# Patient Record
Sex: Female | Born: 1975 | Hispanic: Yes | Marital: Single | Smoking: Never smoker
Health system: Southern US, Community
[De-identification: ages and names within clinical notes are randomized; demographics above are authoritative.]

## PROBLEM LIST (undated history)

## (undated) DIAGNOSIS — T8859XA Other complications of anesthesia, initial encounter: Secondary | ICD-10-CM

## (undated) DIAGNOSIS — O2300 Infections of kidney in pregnancy, unspecified trimester: Secondary | ICD-10-CM

## (undated) DIAGNOSIS — B009 Herpesviral infection, unspecified: Secondary | ICD-10-CM

## (undated) DIAGNOSIS — K219 Gastro-esophageal reflux disease without esophagitis: Secondary | ICD-10-CM

## (undated) HISTORY — PX: APPENDECTOMY: SHX54

## (undated) HISTORY — DX: Infections of kidney in pregnancy, unspecified trimester: O23.00

## (undated) HISTORY — DX: Gastro-esophageal reflux disease without esophagitis: K21.9

---

## 1998-04-06 ENCOUNTER — Inpatient Hospital Stay (HOSPITAL_COMMUNITY): Admission: EM | Admit: 1998-04-06 | Discharge: 1998-04-07 | Payer: Self-pay | Admitting: Emergency Medicine

## 1998-12-29 ENCOUNTER — Ambulatory Visit (HOSPITAL_COMMUNITY): Admission: RE | Admit: 1998-12-29 | Discharge: 1998-12-29 | Payer: Self-pay | Admitting: Obstetrics

## 1999-06-30 ENCOUNTER — Inpatient Hospital Stay (HOSPITAL_COMMUNITY): Admission: AD | Admit: 1999-06-30 | Discharge: 1999-07-02 | Payer: Self-pay | Admitting: *Deleted

## 1999-06-30 ENCOUNTER — Inpatient Hospital Stay (HOSPITAL_COMMUNITY): Admission: AD | Admit: 1999-06-30 | Discharge: 1999-06-30 | Payer: Self-pay | Admitting: Obstetrics

## 1999-06-30 ENCOUNTER — Encounter: Payer: Self-pay | Admitting: Obstetrics & Gynecology

## 2000-04-04 ENCOUNTER — Encounter: Admission: RE | Admit: 2000-04-04 | Discharge: 2000-04-04 | Payer: Self-pay | Admitting: Family Medicine

## 2006-02-20 ENCOUNTER — Inpatient Hospital Stay (HOSPITAL_COMMUNITY): Admission: AD | Admit: 2006-02-20 | Discharge: 2006-02-20 | Payer: Self-pay | Admitting: Obstetrics

## 2006-03-22 ENCOUNTER — Inpatient Hospital Stay (HOSPITAL_COMMUNITY): Admission: AD | Admit: 2006-03-22 | Discharge: 2006-03-24 | Payer: Self-pay | Admitting: Obstetrics

## 2011-05-10 ENCOUNTER — Encounter: Payer: Self-pay | Admitting: Family Medicine

## 2011-05-10 ENCOUNTER — Ambulatory Visit (INDEPENDENT_AMBULATORY_CARE_PROVIDER_SITE_OTHER): Payer: Self-pay | Admitting: Family Medicine

## 2011-05-10 VITALS — BP 120/71 | HR 80 | Ht 64.25 in | Wt 150.6 lb

## 2011-05-10 DIAGNOSIS — E663 Overweight: Secondary | ICD-10-CM | POA: Insufficient documentation

## 2011-05-10 DIAGNOSIS — Z6825 Body mass index (BMI) 25.0-25.9, adult: Secondary | ICD-10-CM

## 2011-05-10 NOTE — Progress Notes (Signed)
  Subjective:    Patient ID: Amy Salazar, female    DOB: April 25, 1976, 35 y.o.   MRN: [id number]  HPI  Patient is here to establish care.  Was seen at this clinic over 10 years ago for prenatal care, but has been going to Urgent Care centers since.  Wants to have a PCP for annual physical exams, cancer screening, etc.  Patient currently has no concerns or complaints.   Has not been to the hospital or ED recently.  Eats a well balanced diet but does not exercise.  Doing well overall.    Review of Systems  Constitutional: Negative for fever, chills, appetite change and fatigue.  HENT: Negative for congestion and rhinorrhea.   Respiratory: Negative for cough, chest tightness and shortness of breath.   Cardiovascular: Negative for chest pain.  Genitourinary: Negative for difficulty urinating.  Neurological: Negative for headaches.       Objective:   Physical Exam  Constitutional: She appears well-developed and well-nourished.       overweight  Neck: Normal range of motion. Neck supple.  Cardiovascular: Normal rate and regular rhythm.  Exam reveals no gallop and no friction rub.   No murmur heard. Pulmonary/Chest: Effort normal and breath sounds normal. No respiratory distress. She has no wheezes. She has no rales. She exhibits no tenderness.  Skin: Skin is warm and dry.  Psychiatric: She has a normal mood and affect. Her behavior is normal.          Assessment & Plan:

## 2011-05-11 NOTE — Patient Instructions (Signed)
Thanks for coming in today. Please schedule an annual adult physical in 2-4 months. Also, schedule a lab visit with Kendal Hymen for a CBC and cholesterol panel. Please do not hesitate to call our office if you have any questions/concerns, Dr. Tye Savoy

## 2011-05-11 NOTE — Assessment & Plan Note (Signed)
Discussed importance of eating a well balanced diet and exercising 3-4 times per week.   Will follow up in 4 months for annual adult physical.  Will draw BMET and FLP before appointment.  Will discuss further preventative medicine, cancer screening, etc. Patient understood and agreed with plan.

## 2011-05-24 ENCOUNTER — Encounter: Payer: Self-pay | Admitting: Family Medicine

## 2011-06-23 ENCOUNTER — Ambulatory Visit (INDEPENDENT_AMBULATORY_CARE_PROVIDER_SITE_OTHER): Payer: Self-pay | Admitting: Family Medicine

## 2011-06-23 ENCOUNTER — Other Ambulatory Visit (HOSPITAL_COMMUNITY)
Admission: RE | Admit: 2011-06-23 | Discharge: 2011-06-23 | Disposition: A | Discharge status: Home / Self Care | Payer: Self-pay | Source: Ambulatory Visit | Attending: Family Medicine | Admitting: Family Medicine

## 2011-06-23 VITALS — BP 110/69 | HR 64 | Temp 97.7°F | Wt 153.0 lb

## 2011-06-23 DIAGNOSIS — N9489 Other specified conditions associated with female genital organs and menstrual cycle: Secondary | ICD-10-CM

## 2011-06-23 DIAGNOSIS — B009 Herpesviral infection, unspecified: Secondary | ICD-10-CM | POA: Insufficient documentation

## 2011-06-23 DIAGNOSIS — R300 Dysuria: Secondary | ICD-10-CM

## 2011-06-23 DIAGNOSIS — N766 Ulceration of vulva: Secondary | ICD-10-CM

## 2011-06-23 DIAGNOSIS — Z124 Encounter for screening for malignant neoplasm of cervix: Secondary | ICD-10-CM

## 2011-06-23 DIAGNOSIS — Z01419 Encounter for gynecological examination (general) (routine) without abnormal findings: Secondary | ICD-10-CM | POA: Insufficient documentation

## 2011-06-23 DIAGNOSIS — N912 Amenorrhea, unspecified: Secondary | ICD-10-CM | POA: Insufficient documentation

## 2011-06-23 DIAGNOSIS — N898 Other specified noninflammatory disorders of vagina: Secondary | ICD-10-CM

## 2011-06-23 DIAGNOSIS — Z331 Pregnant state, incidental: Secondary | ICD-10-CM

## 2011-06-23 LAB — POCT URINALYSIS DIPSTICK
Bilirubin, UA: NEGATIVE
Glucose, UA: NEGATIVE
Ketones, UA: 15
Nitrite, UA: NEGATIVE
Protein, UA: NEGATIVE
Spec Grav, UA: >=1.03
Urobilinogen, UA: 0.2
pH, UA: 5.5

## 2011-06-23 LAB — POCT URINE PREGNANCY: Preg Test, Ur: POSITIVE

## 2011-06-23 LAB — POCT WET PREP (WET MOUNT): Trichomonas Wet Prep HPF POC: NEGATIVE

## 2011-06-23 LAB — POCT UA - MICROSCOPIC ONLY

## 2011-06-23 MED ORDER — FLUCONAZOLE 150 MG PO TABS: 150.0000 mg | ORAL_TABLET | Freq: Once | ORAL | Status: AC

## 2011-06-23 MED ORDER — ACYCLOVIR 400 MG PO TABS: ORAL_TABLET | ORAL | Status: DC

## 2011-06-23 NOTE — Progress Notes (Signed)
  Subjective:    Patient ID: Amy Salazar, female    DOB: 03/29/1976, 35 y.o.   MRN: [id number]  HPI   4 days of dysuria, No urinary frequency.  Some lower pelvic pain.  Vaginal discharge, no bleeding vaginally or with urine.  Notes no period since 10th-15th of May. Has not checked a pregnancy test.  Is unmarried, not a long term relationship 9-10 months. Was not planning pregnancy.  No birth control. Sometimes condoms.  No history of previous UTI or vaginal infections.  G3- children ages 42, 5, 61.  I have reviewed patient's  PMH, FH, and Social history and Medications as related to this visit.  Review of Systems No fever, nausea vomiting.  + chills. Denies tender beasts, mood change.    Objective:   Physical Exam  GEN: Alert & Oriented, No acute distress CV:  Regular Rate & Rhythm, no murmur Respiratory:  Normal work of breathing, CTAB Abd:  + BS, soft, no tenderness to palpation Ext: no pre-tibial edema Pelvic Exam:        External: normal female genitalia without lesions or masses        Vagina: normal without lesions or masses.  Copious white vaginal discharge notes in vagina        Cervix: normal without lesions or masses        Adnexa: normal bimanual exam without masses or fullness        Uterus: normal by palpation        Pap smear: performed        Samples for Wet prep, GC/Chlamydia obtained        HSV culture obtained.  Many painful shallow ulcerations located around labia minora, opening of vagina       Assessment & Plan:

## 2011-06-23 NOTE — Patient Instructions (Signed)
Make follow-up in 1 week El ABC del Psychiatrist (ABC's of Pregnancy) A La atencin antes del parto es muy importante. Asegrese de Science writer a su mdico y recibir atencin prenatal tan rpido como usted crea que est embarazada. En este momento, se la controlar por posibles infecciones, anormalidades genticas y problemas potenciales. Este es el momento para discutir la dieta, el ejercicio, el [place], los medicamentos, el [place], los medicamentos para el dolor durante el parto y la posibilidad de un parto por Copy. Haga todas las preguntas que puedan preocuparla. Es importante que consulte a su mdico con regularidad durante todo Firefighter. Evite el contacto con sustancias txicas y productos qumicos, como disolventes de limpieza, plomo y mercurio, algunos insecticidas y pinturas. Las mujeres embarazadas deben evitar la exposicin a los vapores de Power, y los humos que la hagan sentir enferma, mareada o dbil. Cuando sea posible, tenga una consulta con su mdico antes del embarazo para recibir Optometrist pudiera sugerir, como tomar cido flico, Radio producer ejercicios, dejar de fumar, evitar las bebidas alcohlicas, etc. B La lactancia materna es la opcin ms saludable para usted y su beb. Tiene muchos beneficios nutricionales para al beb y [address]. Tambin crea un vnculo muy estrecho entre el beb y [place]. Hable con su mdico, su familia, sus amigos y su empleador acerca de cmo usted decidir alimentar a su beb y como pueden ayudarle a decidir. No todos los defectos congnitos pueden ser evitados, pero una mujer puede tomar decisiones para aumentar las posibilidades de tener un Beb sano. Muchos defectos congnitos ocurren al principio del Psychiatrist, a veces antes de que la mujer sepa que est [place]. Los defectos o anormalidades congnitas de cualquier nio de su familia o la del padre deben ser comentados con su mdico. Obtener un buen sostn para los cambios del tamao de las [place].  selo en especial cuando hace ejercicios o cuando amamante.   C Festeje la noticia de su embarazo con su Cnyuge/padre y su familia. Las Clases de parto son tiles para usted y Product/process development scientist cnyuge/padre, porque le ayudan a entender lo que sucede durante el Psychiatrist y [place]. El parto por cesrea se debe discutir con el mdico para estar preparado para esa posibilidad. Las ventajas y las desventajas de la [place] si es un nio, se deben comentar con Presenter, broadcasting. El tabaquismo durante el embarazo puede dar como resultado bebs con bajo peso al nacer. Se ha asociado con la infertilidad, abortos espontneos, embarazos ectpicos, mortalidad infantil y Insurance underwriter salud (morbilidad) en la infancia. Adems, el tabaquismo puede causar problemas de aprendizaje a largo plazo. Si usted fuma, debe tratar de dejar de fumar antes de [place] y no durante el embarazo. El humo secundario tambin puede hacerles dao a la mam y a su beb en desarrollo. Es Neomia Dear buena idea pedirle a la gente que deje de fumar a su alrededor Academic librarian y despus del nacimiento del beb. El [place] extra es necesario durante el Psychiatrist y se Occupational psychologist en las vitaminas prenatales, en los productos lcteos, vegetales de hojas verdes y en los suplementos de calcio. D Una Dieta saludable de acuerdo a su peso y su talla actual, junto con las vitaminas y suplementos minerales debe ser discutida con su mdico. El abuso /o la violencia Domstica deben darse a conocer inmediatamente para corregir la situacin. Tome ms agua cuando haga ejercicios para mantenerse hidratada. Por lo general las molestias de la espalda y las piernas aparecen y [place] a  partir de mediados del segundo trimestre hasta el nacimiento del beb. Eso se debe al aumento de tamao del beb y del tero, que tambin puede afectar su equilibro. No consuma Drogas. Las drogas ilegales pueden daar seriamente al beb y a usted. Beba ms lquidos (lo mejor es el agua) Durante todo el  embarazo para ayudar a su cuerpo a Radio producer aumento del volumen sanguneo. Beba por lo menos 6 a 8 vasos de [place], jugo de frutas o [employer]. Una buena manera de saber que est bebiendo suficiente lquido es cuando la [place] se ve casi como el agua clara o de color amarillo muy claro.   E Coma alimentos sanos para obtener los nutrientes que usted y su beb necesitan al Psychologist, clinical. Sus alimentos deben Johnson [employer] Johnson cinco grupos bsicos. El Ejercicio (30 minutos de ejercicio leve a moderado al da) es importante y se aconseja durante el [place], si no tiene problemas de [place]. Si el ejercicio le causa malestar o mareos debe interrumpirlo e informar a su mdico. Las emociones durante el embarazo pueden pasar de la euforia a la depresin y deben ser comprendidos por usted, su pareja y su familia. F La evaluacin fetal con ecografas, la amniocentesis y los controles durante el [place] y [place] parto son algo habitual y a Research officer, political party. Tome 400 mg. de cido Southern Company, si es posible antes y Energy Transfer Partners primeros meses del Psychiatrist para reducir el riesgo de defectos congnitos del cerebro y la columna vertebral. Todas las mujeres que podran quedar embarazadas deben tomar una vitamina con cido flico todos [place]. Tambin es importante seguir una dieta saludable con alimentos enriquecidos (cereales enriquecidos, arroz, panes y pastas) y alimentos que sean fuentes naturales de cido flico (jugo de naranja, vegetales de hojas verdes, frijoles, man, brcoli, esprragos, arvejas y Therapist, occupational). El padre debe involucrarse en todos los aspectos del embarazo incluyendo el cuidado prenatal, las clases de [place], [place] parto y el tiempo despus del [place]. Los padres tambin pueden tener problemas emocionales como ser [place], [place] econmicos y llevar adelante [place]. G Las pruebas Genticas se deben Landscape architect. Es Secretary/administrator la historia de su familia y la del padre. Si ha habido problemas con  embarazos o defectos de nacimiento en su familia, informe de inmediato a su mdico. Adems, los consejeros genticos pueden hablar con usted acerca de la informacin que pueda necesitar en la toma de decisiones acerca de tener una familia. Usted puede llamar a un centro mdico en su rea para ayudar en la bsqueda de un consejero gentico certificado por el consejo. La asesora y prueba gentica se deben hacer antes del embarazo siempre que sea posible, especialmente si hay antecedentes de problemas en la familia del padre o de la [place]. Ciertos grupos tnicos tienen un mayor riesgo de defectos genticos. H Familiarcese con [facility] en el que va a tener su beb. Conozca cunto tiempo se tarda en llegar, la sala de preparto y nacimiento y los procedimientos del hospital. Asegrese de que su seguro mdico es aceptado en ese Environmental consultant. Haga que su Hogar est listo para el beb, incluyendo la ropa, la habitacin del beb (cuando sea posible), muebles y asientos del automvil. El lavado de manos es importante durante todo el da, especialmente despus de tocar carne cruda y aves de corral, de cambiar el paal del beb y de ir al bao. Esto puede ayudarle a Geologist, engineering de grmenes (bacterias) y virus que causan infecciones. Su pelo  puede volverse seco y ms fino, pero volver a la normalidad unas semanas despus de que nazca el beb. La acidez es un problema comn que puede ser tratado tomando anticidos recetados por su mdico, comiendo alimentos en porciones ms pequeas 5  6 veces por da, no beber lquidos al comer, beber entre comidas y elevar la cabecera de su cama 2  3 pulgadas. I El seguro que le d cobertura a usted y al beb, a los gastos del mdico y del hospital debe ser revisado, para saber con anticipacin qu gastos no cubiertos por su plan de seguro deber pagar usted. Si no tiene seguro mdico, por lo general hay clnicas y servicios disponibles para usted en su comunidad. Tome 30 mg. de  hierro durante el [employer] Lots segn lo prescrito por su mdico para reducir el riesgo de niveles bajos de glbulos rojos (anemia) mas adelante en el embarazo. Todas las mujeres en edad frtil deben consumir una dieta rica en hierro. J La madre, el padre y los otros hijos deben hacer un esfuerzo conjunto para adaptarse al [employer] Lots en el aspecto econmico, emocional y psicolgico durante Firefighter. nase a un grupo de apoyo para las futuras [place]. O bien, vaya a clases de crianza de hijos o del parto. La familia tiene que participar cuando sea posible. K Conozca sus lmites. Infrmele a su mdico si experimenta:    Cualquier tipo de dolor.   Clicos fuertes.     Aumenta de peso en un corto perodo de tiempo (5 libras en 3 a 5 das).     Hemorragia vaginal, prdida de lquido amnitico.     Dolor de [place], problemas de visin.     Mareos, Newell [employer]bermaid, le falta el aire.     Dolor en el pecho.     Fiebre de 101 o ms.     Elimina lquido claro por la vagina.     Dolor al Beatrix Shipper.     Violencia familiar.     Latidos irregulares del corazn (palpitaciones).   Latidos rpidos del corazn (taquicardia).     Siente ganas de vomitar (nuseas) y vmitos.     Dificultad para caminar, retencin de lquidos (edema).     Debilidad muscular.     Si su beb tiene disminucin de [place].     Diarrea persistente.     Flujo vaginal anormal.     Contracciones uterinas en intervalos de 20 minutos.     Dolor de espalda que baja por la pierna.     L Aprenda y practique que, Lo que usted come y bebe debe ser con moderacin y sano para usted y su beb. Las drogas PepsiCo el alcohol y la cafena son temas importantes para las mujeres [place]. No hay una cantidad segura de alcohol que una mujer pueda beber durante el [place]. El sndrome de alcoholismo fetal, un trastorno que se caracteriza por retraso del crecimiento, anormalidades faciales y disfuncin del sistema nervioso central, es  causado por el uso de alcohol de la mujer durante el [place]. La cafena, que se encuentra en el t, caf, refrescos y chocolate, tambin deben ser limitados. Asegrese de leer las etiquetas cuando se trata de reducir el consumo de cafena durante el [place]. Ms de 200 alimentos, bebidas y medicamentos sin receta contienen cafena y tienen un ato contenido de sal! Hay cafs y tes que no contienen cafena.   M Las enfermedades mdicas tales como diabetes, epilepsia e hipertensin arterial deben ser tratados y mantenidos bajo control antes  del embarazo siempre que sea posible, pero especialmente durante el embarazo. Consulte con su mdico acerca de cualquier medicamento que pueda ser necesario cambiar o ajustar la dosis. Si est tomando algn medicamento, consulte con su mdico sobre su seguridad para Celanese Corporation o antes de quedar [place], cuando sea posible. Tambin, asegrese de informar todas las hierbas o vitaminas que est tomando. Tambin se trata de medicamentos! Hable con su mdico sobre The Mutual of Omaha, con receta y de venta libre que est tomando. Durante su visita prenatal, discuta los Chesapeake Energy su mdico le puede dar durante el parto y el nacimiento. N Nunca tenga miedo de preguntar a su mdico acerca de su salud, el progreso del [place], problemas familiares, situaciones de estrs y pedirle la recomendacin de un pediatra, si no tiene uno. Es mejor tomar todas las precauciones y Administrator, Civil Service pregunta o preocupacin que usted tenga durante las visitas a su consultorio. Es Neomia Dear buena idea escribir sus preguntas antes de visitar al American Express. O Los medicamentos de [place] para tos y resfriados pueden contener alcohol u otros ingredientes que se deben Theatre manager. Consulte con su mdico sobre los Express Scripts, hierbas o medicamentos de venta libre que est tomando o puede tomar durante el Psychiatrist.   P La actividad fsica durante el  embarazo puede beneficiarla tanto a usted y a su beb al disminuir la incomodidad y la fatiga produciendo una sensacin de [place], y el aumento de la probabilidad de una pronta recuperacin despus del parto. El ejercicio ligero a moderado Academic librarian fortalece el vientre (abdomen) y msculos de la espalda. Esto le ayuda a Banker. Practicar yoga, caminar, nadar y montar en una bicicleta fija son por lo general ejercicios seguros para las mujeres embarazadas. Evite el buceo, ejercicios de gran altura (ms de 3000 pies), esqu, paseos a caballo, deportes de contacto, etc. Consulte siempre con su mdico antes de comenzar cualquier tipo de ejercicio, Especialmente durante el embarazo y, especialmente si no hacan ejercicios antes de quedar embarazada. Q Las nuseas y Dentist estomacal son comunes durante el Psychiatrist. Coma un par de galletitas o tostadas secas antes de levantarse de la cama. Los alimentos que normalmente le gustan pueden hacerla sentir mal del [place]. Puede que tenga que sustituir otros alimentos nutritivos. Comer cinco o seis porciones pequeas al Geophysical data processor de tres grandes pueden hacer que usted se sienta mejor. No beba con sus alimentos, beba Charter Communications. Las preguntas Que usted tiene deben estar escritas y consultadas durante sus visitas prenatales. R Lea y haga planes para que su casa sea segura para el beb. Hay consejos importantes para que su hogar sea un ambiente ms seguro para su beb. Revise los consejos y haga su hogar ms seguro para usted y su beb. Lea las etiquetas de los alimentos con respecto a las caloras, sal y Owens[employer]Illinois. S Las salas de saunas, baeras de [place] caliente y vapor deben evitarse durante Firefighter. El calor excesivo puede ser perjudicial durante el [place]. Su mdico la examinar para Hotel manager de transmisin sexual y trastornos genticos durante las visitas prenatales. Aprenda los Signos del [place] de  [place]. Las relaciones Sexuales durante el Psychiatrist son seguras a menos que haya un problema mdico o del Psychiatrist y su mdico le recomiende evitarlas. T Se debe evitar viajar largas distancias, especialmente en el tercer trimestre de su embarazo. Si tiene que viajar afuera del [place], asegrese de llevar una copia  de sus registros mdicos y un plan de seguro mdico. Usted no debe recorrer largas distancias sin consultar con su mdico primero. La mayora de las campaas areas no permiten viajar despus de 36 semanas de embarazo. La Toxoplasmosis es una infeccin causada por un parsito que puede daar seriamente al beb nonato. Evite comer carne poco cocida y tocar la arena higinica para gatos. Asegrese de usar guantes para la Automotive engineer. El hormigueo de las manos y los dedos no es inusual y se debe a la retencin de lquidos. Esto desaparece despus del nacimiento del beb. U Aumenta el tamao del tero durante Designer, fashion/clothing. Sus riones comenzarn a funcionar ms eficientemente. Esto puede hacer que Usted sienta la necesidad de orinar ms a menudo. Tambin puede tener fugas de orina al estornudar, toser o rer. Esto se debe a que el crecimiento del tero presiona la vejiga, que se encuentra directamente adelante y ligeramente por debajo del tero durante los primeros meses del [place]. Si experimenta ardor con frecuencia al Beatrix Shipper o en la orina presenta sangre, asegrese de decirle a su mdico. El tamao de su tero en el tercer trimestre puede causar un problema con el equilibro. Es recomendable mantener una buena postura y Automotive engineer usar tacones altos durante ese tiempo. Un Ultrasonido de su beb puede ser necesario durante el embarazo y es seguro para usted y su beb. V Las vacunas son Neomia Dear preocupacin importante para las mujeres embarazadas. Pngase las vacunas necesarias antes del embarazo. El centro para el control de enfermedades ([email or web address]) tiene directrices claras para el uso de vacunas durante  el [place]. Revise la lista, augrese de Science writer con su mdico. Las Vitaminas prenatales son tiles y saludables para usted y el beb. No tome otras vitaminas, excepto lo que se recomiende. Tomar algunas vitaminas en exceso puede causar problemas de sobredosis. Los vmitos continuos deben ser reportados a su mdico. Las venas Varicosas pueden aparecer sobre todo si hay antecedentes familiares. Deben desaparecer despus del nacimiento del beb. Usar calzas ayuda si no le producen molestias. W Tener sobrepeso o bajo peso durante el embarazo puede causarle problemas. Trate de estar en un rango de 15 libras de su peso ideal antes del Psychiatrist. Recuerde, el embarazo no es el momento para estar a dieta! No deje de comer o saltee las comidas si aumenta de [place]. Tanto usted como su beb necesitan las caloras y la nutricin que recibe de una dieta saludable. Asegrese de Science writer con su mdico acerca de su dieta. Hay un plan de frmula y dieta disponibles en funcin de si tiene sobrepeso o bajo peso. Su mdico o nutricionista puede ayudarle y asesorarle si es necesario. X Evite los rayos X. Si usted tiene que hacerse un tratamiento dental o pruebas diagnsticas, informe a su dentista o mdico que est embarazada para eventualmente tomar cuidados extra. Los rayos X slo se deben tomar [employer] de no tomarlos son mayores que el riesgo de tomarlos. Si es necesario, slo una cantidad mnima de radiacin debe ser [place]. Cuando los rayos X son necesarios, se deben usar los escudos protectores de plomo para cubrir las reas del cuerpo que no necesiten visualizarse en la radiografa. Y Su beb la ama. Amamantar a su beb crea un vnculo amoroso y Exxon Mobil Corporation. Dle a su beb un medio ambiente sano para vivir durante el embarazo. Los bebs y nios requieren un cuidado y [place] orientacin constante. Su salud y su seguridad deben ser vigiladas cuidadosamente en todo momento. Despus  de que nazca el  beb, descanse o duerma una siesta cuando el beb est durmiendo. Z Consiga descansar. Asegrese de obtener suficiente descanso. Descanse de lado tan a menudo como sea posible, especialmente se aconseja el lado izquierdo. Proporciona la mejor circulacin para su beb y ayuda a reducir la hinchazn. Trate de tomar una siesta de treinta a cuarenta y cinco minutos en la tarde cuando sea posible. Despus de que nazca el beb, descanse o duerma una siesta cuando el beb est durmiendo. Trate de elevar los pies cuando le sea posible. Ayuda a la circulacin en las piernas y a Building services engineer. La mayora de informacin es de cortesa de CDC. Document Released: 09/05/2007 Document Re-Released: 02/02/2010 [facility] Patient Information 2011 [place], [place].

## 2011-06-24 DIAGNOSIS — R300 Dysuria: Secondary | ICD-10-CM | POA: Insufficient documentation

## 2011-06-24 DIAGNOSIS — Z331 Pregnant state, incidental: Secondary | ICD-10-CM | POA: Insufficient documentation

## 2011-06-24 LAB — GC/CHLAMYDIA PROBE AMP, GENITAL
Chlamydia, DNA Probe: NEGATIVE
GC Probe Amp, Genital: NEGATIVE

## 2011-06-24 NOTE — Assessment & Plan Note (Signed)
Likely due to copious vaginla discharge with yeast and many painful lesions consistent with HPV.  No symptoms or urinary frequency, hesitancy.  Will treat HSV and Yeast, send urine for culture.  Will follow-up in 1 week.

## 2011-06-24 NOTE — Assessment & Plan Note (Signed)
Will treat yeast vaginitis.  GC/Chlamydia, trichomonas neg.

## 2011-06-24 NOTE — Assessment & Plan Note (Signed)
Positive pregnancy test. 

## 2011-06-24 NOTE — Assessment & Plan Note (Addendum)
Consistent with first outbreak of HSV per patient history.  Sent HSv culture.  Unable to draw blood today.  Will treat with acyclovir x 7 days for first outbreak.  Update: genital culture + HSV 2

## 2011-06-24 NOTE — Assessment & Plan Note (Addendum)
Patient had incidental finding of positive pregnancy test.  She is very excited and already plans on contacting adopt-a-mom.  She plans on getting prenatal care here at [facility]- will draw labs as soon as status approved.  LMP May 10th, patient feel confident of this dating making her 11+6 today.  We discuss genetic screening, she declines and would like to think on it more.  I discussed the time sensitive nature of screening.  Also told her AMA is an indication for referral to genetic counseling.  Will defer this to PCP. She has been taking prenatal vitamins and states she will continue to do so .

## 2011-06-25 LAB — URINE CULTURE
Colony Count: NO GROWTH
Organism ID, Bacteria: NO GROWTH

## 2011-06-25 NOTE — Progress Notes (Signed)
Addended by: Macy Mis on: 06/25/2011 09:55 AM   Modules accepted: Orders

## 2011-06-28 ENCOUNTER — Ambulatory Visit (INDEPENDENT_AMBULATORY_CARE_PROVIDER_SITE_OTHER): Payer: Self-pay | Admitting: Family Medicine

## 2011-06-28 DIAGNOSIS — N898 Other specified noninflammatory disorders of vagina: Secondary | ICD-10-CM

## 2011-06-28 DIAGNOSIS — R300 Dysuria: Secondary | ICD-10-CM

## 2011-06-28 DIAGNOSIS — B009 Herpesviral infection, unspecified: Secondary | ICD-10-CM

## 2011-06-28 DIAGNOSIS — Z331 Pregnant state, incidental: Secondary | ICD-10-CM

## 2011-06-28 NOTE — Progress Notes (Signed)
  Subjective:    Patient ID: Amy Salazar, female    DOB: Apr 18, 1976, 35 y.o.   MRN: [id number]  HPI Here for follow-up of herpes labialis.  Started acyclovir and fluconazole.  Notes much improved pain.  No more dysuria.  Continues to take prenatal vitamins.  Plans to call [facility] mom to establish pregnancy care. Review of Systems No fever, chills, nausea, vomiting, abdominal pain, vaginal bleeding or discharge.    Objective:   Physical Exam Pelvic Exam:        External: normal female genitalia without healing herpetic lesions on left labia.  Much less tender to palpation.               Assessment & Plan:

## 2011-06-28 NOTE — Assessment & Plan Note (Addendum)
taking pnv vitamins.  Pap normal.  Urged to contact [facility] mom to start prenatal care

## 2011-06-28 NOTE — Assessment & Plan Note (Signed)
Urine culture was negative.

## 2011-06-28 NOTE — Assessment & Plan Note (Signed)
Gc/chlamydia: negative

## 2011-06-28 NOTE — Assessment & Plan Note (Signed)
Will complete course for acyclovir x 7 days.  Ulcers healing with no new outbreaks.  Discussed with patient avoidance of sexual contact during outbreaks and condoms always.  We also discussed chronicity of disease and importance of prophylaxis at 36 weeks.

## 2011-09-17 ENCOUNTER — Encounter (HOSPITAL_COMMUNITY): Payer: Self-pay

## 2011-09-17 ENCOUNTER — Inpatient Hospital Stay (HOSPITAL_COMMUNITY): Payer: Medicaid Other

## 2011-09-17 ENCOUNTER — Inpatient Hospital Stay (HOSPITAL_COMMUNITY)
Admission: AD | Admit: 2011-09-17 | Discharge: 2011-09-20 | DRG: 781 | Disposition: A | Discharge status: Home / Self Care | Payer: Medicaid Other | Source: Ambulatory Visit | Attending: Obstetrics & Gynecology | Admitting: Obstetrics & Gynecology

## 2011-09-17 DIAGNOSIS — O4700 False labor before 37 completed weeks of gestation, unspecified trimester: Secondary | ICD-10-CM | POA: Diagnosis present

## 2011-09-17 DIAGNOSIS — R509 Fever, unspecified: Secondary | ICD-10-CM

## 2011-09-17 DIAGNOSIS — O2300 Infections of kidney in pregnancy, unspecified trimester: Secondary | ICD-10-CM | POA: Diagnosis present

## 2011-09-17 DIAGNOSIS — O479 False labor, unspecified: Secondary | ICD-10-CM

## 2011-09-17 DIAGNOSIS — N12 Tubulo-interstitial nephritis, not specified as acute or chronic: Secondary | ICD-10-CM | POA: Diagnosis present

## 2011-09-17 DIAGNOSIS — A498 Other bacterial infections of unspecified site: Secondary | ICD-10-CM | POA: Diagnosis present

## 2011-09-17 DIAGNOSIS — O2390 Unspecified genitourinary tract infection in pregnancy, unspecified trimester: Principal | ICD-10-CM | POA: Diagnosis present

## 2011-09-17 HISTORY — DX: Herpesviral infection, unspecified: B00.9

## 2011-09-17 LAB — RUBELLA SCREEN: Rubella: >500 IU/mL — ABNORMAL HIGH

## 2011-09-17 LAB — DIFFERENTIAL
Eosinophils Absolute: 0 10*3/uL (ref 0.0–0.7)
Eosinophils Relative: 0 % (ref 0–5)
Lymphocytes Relative: 4 % — ABNORMAL LOW (ref 12–46)
Lymphs Abs: 0.8 10*3/uL (ref 0.7–4.0)
Monocytes Absolute: 0.8 10*3/uL (ref 0.1–1.0)

## 2011-09-17 LAB — URINALYSIS, ROUTINE W REFLEX MICROSCOPIC
Bilirubin Urine: NEGATIVE
Glucose, UA: NEGATIVE mg/dL
Ketones, ur: 15 mg/dL — AB
Nitrite: NEGATIVE
pH: 6 (ref 5.0–8.0)

## 2011-09-17 LAB — WET PREP, GENITAL
Clue Cells Wet Prep HPF POC: NONE SEEN
Yeast Wet Prep HPF POC: NONE SEEN

## 2011-09-17 LAB — CBC
HCT: 29.3 % — ABNORMAL LOW (ref 36.0–46.0)
MCH: 31.1 pg (ref 26.0–34.0)
MCV: 89.3 fL (ref 78.0–100.0)
RBC: 3.28 MIL/uL — ABNORMAL LOW (ref 3.87–5.11)
WBC: 20.2 10*3/uL — ABNORMAL HIGH (ref 4.0–10.5)

## 2011-09-17 LAB — URINE MICROSCOPIC-ADD ON

## 2011-09-17 LAB — FETAL FIBRONECTIN: Fetal Fibronectin: NEGATIVE

## 2011-09-17 LAB — HEPATITIS B SURFACE ANTIGEN: Hepatitis B Surface Ag: NEGATIVE

## 2011-09-17 MED ORDER — PRENATAL PLUS 27-1 MG PO TABS
1.0000 | ORAL_TABLET | Freq: Every day | ORAL | Status: DC
  Administered 2011-09-18 – 2011-09-19 (×2): 1 via ORAL
  Filled 2011-09-17 (×3): qty 1

## 2011-09-17 MED ORDER — DEXTROSE 5 % IV SOLN
1.0000 g | Freq: Two times a day (BID) | INTRAVENOUS | Status: DC
  Administered 2011-09-17 – 2011-09-20 (×6): 1 g via INTRAVENOUS
  Filled 2011-09-17 (×7): qty 1

## 2011-09-17 MED ORDER — LACTATED RINGERS IV SOLN: INTRAVENOUS | Status: DC

## 2011-09-17 MED ORDER — DOCUSATE SODIUM 100 MG PO CAPS
100.0000 mg | ORAL_CAPSULE | Freq: Every day | ORAL | Status: DC
  Administered 2011-09-18 – 2011-09-19 (×2): 100 mg via ORAL
  Filled 2011-09-17 (×3): qty 1

## 2011-09-17 MED ORDER — CALCIUM CARBONATE ANTACID 500 MG PO CHEW: 2.0000 | CHEWABLE_TABLET | ORAL | Status: DC | PRN

## 2011-09-17 MED ORDER — ZOLPIDEM TARTRATE 10 MG PO TABS
10.0000 mg | ORAL_TABLET | Freq: Every evening | ORAL | Status: DC | PRN
  Administered 2011-09-18: 10 mg via ORAL
  Filled 2011-09-17: qty 1

## 2011-09-17 MED ORDER — ACETAMINOPHEN 325 MG PO TABS
650.0000 mg | ORAL_TABLET | ORAL | Status: DC | PRN
  Administered 2011-09-17: 650 mg via ORAL
  Filled 2011-09-17: qty 2

## 2011-09-17 MED ORDER — SODIUM CHLORIDE 0.9 % IV SOLN
INTRAVENOUS | Status: DC
  Administered 2011-09-17 – 2011-09-18 (×2): via INTRAVENOUS
  Administered 2011-09-18: 1000 mL via INTRAVENOUS
  Administered 2011-09-18 – 2011-09-20 (×6): via INTRAVENOUS

## 2011-09-17 MED ORDER — ACETAMINOPHEN 500 MG PO TABS
1000.0000 mg | ORAL_TABLET | Freq: Four times a day (QID) | ORAL | Status: DC | PRN
  Administered 2011-09-17: 1000 mg via ORAL
  Filled 2011-09-17: qty 2

## 2011-09-17 MED ORDER — LACTATED RINGERS IV SOLN
Freq: Once | INTRAVENOUS | Status: AC
  Administered 2011-09-17: 15:00:00 via INTRAVENOUS

## 2011-09-17 NOTE — H&P (Signed)
Chief Complaint:  Contractions   Amy Salazar is  35 y.o. [phone].  Patient's last menstrual period was 03/27/2011..   She presents complaining of Contractions Reports contractions and HA since last night. Denies vaginal blding, discharge, or LOF. Reports chills, no fever. Denies cough, sore throat, ear pain.  Obstetrical/Gynecological History: OB History    Grav Para Term Preterm Abortions TAB SAB Ect Mult Living   4 3 3  0 0 0 0 0 0 3      Past Medical History: Past Medical History  Diagnosis Date  . Herpes     Past Surgical History: Past Surgical History  Procedure Date  . Appendectomy     pt was 35 yo    Family History: No family history on file.  Social History: History  Substance Use Topics  . Smoking status: Never Smoker   . Smokeless tobacco: Never Used  . Alcohol Use: No    Allergies: No Known Allergies  Prescriptions prior to admission  Medication Sig Dispense Refill  . prenatal vitamin w/FE, FA (PRENATAL 1 + 1) 27-1 MG TABS Take 1 tablet by mouth daily.        Marland Kitchen acyclovir (ZOVIRAX) 400 MG tablet For 7 days  21 tablet  0    Review of Systems - Negative except what has been reviewed in HPI  Physical Exam   Blood pressure 108/61, pulse 94, temperature 98.1 F (36.7 C), temperature source Oral, resp. rate 20, height 5\' 3"  (1.6 m), weight 73.12 kg (161 lb 3.2 oz), last menstrual period 03/27/2011, SpO2 97.00%.  General: General appearance - alert, well appearing, and in no distress, oriented to person, place, and time and overweight Mental status - alert, oriented to person, place, and time, normal mood, behavior, speech, dress, motor activity, and thought processes, affect appropriate to mood Ears - bilateral TM's and external ear canals normal Nose - normal and patent, no erythema, discharge or polyps Mouth - mucous membranes moist, pharynx normal without lesions Neck - supple, no significant adenopathy Lymphatics - no palpable lymphadenopathy,  no hepatosplenomegaly Chest - clear to auscultation, no wheezes, rales or rhonchi, symmetric air entry Heart - normal rate, regular rhythm, normal S1, S2, no murmurs, rubs, clicks or gallops Abdomen - gravid, nontender, no CVAT Extremities - peripheral pulses normal, no pedal edema, no clubbing or cyanosis Focused Gynecological Exam: VULVA: normal appearing vulva with no masses, tenderness or lesions, VAGINA: normal appearing vagina with normal color and discharge, no lesions, CERVIX: parous cervix, closed/long/think, UTERUS: 22 week size FHR: 150, mod variablity, + 10x10 Toco: irreg UI, no contractions  Labs: Recent Results (from the past 24 hour(s))  WET PREP, GENITAL   Collection Time   09/17/11 12:53 PM      Component Value Range   Yeast, Wet Prep NONE SEEN  NONE SEEN    Trich, Wet Prep NONE SEEN  NONE SEEN    Clue Cells, Wet Prep NONE SEEN  NONE SEEN    WBC, Wet Prep HPF POC FEW (*) NONE SEEN   CBC   Collection Time   09/17/11  1:03 PM      Component Value Range   WBC 20.2 (*) 4.0 - 10.5 (K/uL)   RBC 3.28 (*) 3.87 - 5.11 (MIL/uL)   Hemoglobin 10.2 (*) 12.0 - 15.0 (g/dL)   HCT 45.4 (*) 09.8 - 46.0 (%)   MCV 89.3  78.0 - 100.0 (fL)   MCH 31.1  26.0 - 34.0 (pg)   MCHC 34.8  30.0 -  36.0 (g/dL)   RDW 82.9  56.2 - 13.0 (%)   Platelets 163  150 - 400 (K/uL)  DIFFERENTIAL   Collection Time   09/17/11  1:03 PM      Component Value Range   Neutrophils Relative 92 (*) 43 - 77 (%)   Neutro Abs 18.6 (*) 1.7 - 7.7 (K/uL)   Lymphocytes Relative 4 (*) 12 - 46 (%)   Lymphs Abs 0.8  0.7 - 4.0 (K/uL)   Monocytes Relative 4  3 - 12 (%)   Monocytes Absolute 0.8  0.1 - 1.0 (K/uL)   Eosinophils Relative 0  0 - 5 (%)   Eosinophils Absolute 0.0  0.0 - 0.7 (K/uL)   Basophils Relative 0  0 - 1 (%)   Basophils Absolute 0.0  0.0 - 0.1 (K/uL)  URINALYSIS, ROUTINE W REFLEX MICROSCOPIC   Collection Time   09/17/11  1:10 PM      Component Value Range   Color, Urine YELLOW  YELLOW    Appearance  CLEAR  CLEAR    Specific Gravity, Urine 1.025  1.005 - 1.030    pH 6.0  5.0 - 8.0    Glucose, UA NEGATIVE  NEGATIVE (mg/dL)   Hgb urine dipstick LARGE (*) NEGATIVE    Bilirubin Urine NEGATIVE  NEGATIVE    Ketones, ur 15 (*) NEGATIVE (mg/dL)   Protein, ur 865 (*) NEGATIVE (mg/dL)   Urobilinogen, UA 4.0 (*) 0.0 - 1.0 (mg/dL)   Nitrite NEGATIVE  NEGATIVE    Leukocytes, UA TRACE (*) NEGATIVE   URINE MICROSCOPIC-ADD ON   Collection Time   09/17/11  1:10 PM      Component Value Range   Squamous Epithelial / LPF FEW (*) RARE    WBC, UA 3-6  <3 (WBC/hpf)   RBC / HPF 3-6  <3 (RBC/hpf)   Bacteria, UA MANY (*) RARE    MD Consult: Discussed with Dr. Debroah Loop. Agrees with 23 hour OBS and IV ABX. Imaging Studies:  IUP at [redacted]w[redacted]d: Best EDC 2012-01-29 by LMP CL 3.44cm; AFI: NL, Anatomy: NL, Ant placenta Transverse lie, head maternal left   Assessment: Preterm Contractions Fever of Unknown Etiology Probable UTI    Plan: Admit to OBs: Antenatal  IV ABX   Amy Salazar E. 09/17/2011,2:52 PM

## 2011-09-17 NOTE — Progress Notes (Signed)
Body aches and headache started yesterday.  Denies cough or sore throat.  Cramping like contractions started yesterday.  No bleeding or leaking. No hx of PTL

## 2011-09-17 NOTE — Progress Notes (Signed)
09/17/2011 Amy Salazar  Interpreter  I assisted  Jolynn RN with questions.

## 2011-09-17 NOTE — ED Notes (Signed)
Lab drawn.  Urine colllected.  Tylenol given. Pt taken to [place].  Amy Salazar had explained plan to pt.

## 2011-09-17 NOTE — Progress Notes (Signed)
09/17/2011 Mame Twombly  Interpreter  I assisted Jerene Pitch with plan of care.

## 2011-09-17 NOTE — Progress Notes (Signed)
Pt directly to rm after obtaining height and wt, waiting on translator

## 2011-09-17 NOTE — ED Notes (Signed)
Confirm with interpretor, HA is back, ctx pain worse.  Explained IV, instructed pt to call if needs to use restroom- full bladder can cause more ctx's.  Understands admission,  Waiting on bed

## 2011-09-17 NOTE — Progress Notes (Signed)
  Contacted by RN due to contractions still tracing, q2-4 min apart. Started IV fluids at rate of 150 mL/hr, and they have become less intense and began to space out. Cervical exam of multiparous cervix, long, with internal os closed. Will cont to monitor closely with continuous tocometry, and reevaluate as indicated.

## 2011-09-17 NOTE — ED Provider Notes (Signed)
Chief Complaint:  Contractions   Amy Salazar is  35 y.o. [phone].  Patient's last menstrual period was 03/27/2011..   She presents complaining of Contractions Reports contractions and HA since last night. Denies vaginal blding, discharge, or LOF. Reports chills, no fever. Denies cough, sore throat, ear pain.  Obstetrical/Gynecological History: OB History    Grav Para Term Preterm Abortions TAB SAB Ect Mult Living   4 3 3  0 0 0 0 0 0 3      Past Medical History: Past Medical History  Diagnosis Date  . Herpes     Past Surgical History: Past Surgical History  Procedure Date  . Appendectomy     pt was 35 yo    Family History: No family history on file.  Social History: History  Substance Use Topics  . Smoking status: Never Smoker   . Smokeless tobacco: Never Used  . Alcohol Use: No    Allergies: No Known Allergies  Prescriptions prior to admission  Medication Sig Dispense Refill  . prenatal vitamin w/FE, FA (PRENATAL 1 + 1) 27-1 MG TABS Take 1 tablet by mouth daily.        Marland Kitchen acyclovir (ZOVIRAX) 400 MG tablet For 7 days  21 tablet  0    Review of Systems - Negative except what has been reviewed in HPI  Physical Exam   Blood pressure 108/61, pulse 94, temperature 98.1 F (36.7 C), temperature source Oral, resp. rate 20, height 5\' 3"  (1.6 m), weight 73.12 kg (161 lb 3.2 oz), last menstrual period 03/27/2011, SpO2 97.00%.  General: General appearance - alert, well appearing, and in no distress, oriented to person, place, and time and overweight Mental status - alert, oriented to person, place, and time, normal mood, behavior, speech, dress, motor activity, and thought processes, affect appropriate to mood Ears - bilateral TM's and external ear canals normal Nose - normal and patent, no erythema, discharge or polyps Mouth - mucous membranes moist, pharynx normal without lesions Neck - supple, no significant adenopathy Lymphatics - no palpable lymphadenopathy,  no hepatosplenomegaly Chest - clear to auscultation, no wheezes, rales or rhonchi, symmetric air entry Heart - normal rate, regular rhythm, normal S1, S2, no murmurs, rubs, clicks or gallops Abdomen - gravid, nontender, no CVAT Extremities - peripheral pulses normal, no pedal edema, no clubbing or cyanosis Focused Gynecological Exam: VULVA: normal appearing vulva with no masses, tenderness or lesions, VAGINA: normal appearing vagina with normal color and discharge, no lesions, CERVIX: parous cervix, closed/long/think, UTERUS: 22 week size FHR: 150, mod variablity, + 10x10 Toco: irreg UI, no contractions  Labs: Recent Results (from the past 24 hour(s))  WET PREP, GENITAL   Collection Time   09/17/11 12:53 PM      Component Value Range   Yeast, Wet Prep NONE SEEN  NONE SEEN    Trich, Wet Prep NONE SEEN  NONE SEEN    Clue Cells, Wet Prep NONE SEEN  NONE SEEN    WBC, Wet Prep HPF POC FEW (*) NONE SEEN   CBC   Collection Time   09/17/11  1:03 PM      Component Value Range   WBC 20.2 (*) 4.0 - 10.5 (K/uL)   RBC 3.28 (*) 3.87 - 5.11 (MIL/uL)   Hemoglobin 10.2 (*) 12.0 - 15.0 (g/dL)   HCT 11.9 (*) 14.7 - 46.0 (%)   MCV 89.3  78.0 - 100.0 (fL)   MCH 31.1  26.0 - 34.0 (pg)   MCHC 34.8  30.0 -  36.0 (g/dL)   RDW 16.1  09.6 - 04.5 (%)   Platelets 163  150 - 400 (K/uL)  DIFFERENTIAL   Collection Time   09/17/11  1:03 PM      Component Value Range   Neutrophils Relative 92 (*) 43 - 77 (%)   Neutro Abs 18.6 (*) 1.7 - 7.7 (K/uL)   Lymphocytes Relative 4 (*) 12 - 46 (%)   Lymphs Abs 0.8  0.7 - 4.0 (K/uL)   Monocytes Relative 4  3 - 12 (%)   Monocytes Absolute 0.8  0.1 - 1.0 (K/uL)   Eosinophils Relative 0  0 - 5 (%)   Eosinophils Absolute 0.0  0.0 - 0.7 (K/uL)   Basophils Relative 0  0 - 1 (%)   Basophils Absolute 0.0  0.0 - 0.1 (K/uL)  URINALYSIS, ROUTINE W REFLEX MICROSCOPIC   Collection Time   09/17/11  1:10 PM      Component Value Range   Color, Urine YELLOW  YELLOW    Appearance  CLEAR  CLEAR    Specific Gravity, Urine 1.025  1.005 - 1.030    pH 6.0  5.0 - 8.0    Glucose, UA NEGATIVE  NEGATIVE (mg/dL)   Hgb urine dipstick LARGE (*) NEGATIVE    Bilirubin Urine NEGATIVE  NEGATIVE    Ketones, ur 15 (*) NEGATIVE (mg/dL)   Protein, ur 409 (*) NEGATIVE (mg/dL)   Urobilinogen, UA 4.0 (*) 0.0 - 1.0 (mg/dL)   Nitrite NEGATIVE  NEGATIVE    Leukocytes, UA TRACE (*) NEGATIVE   URINE MICROSCOPIC-ADD ON   Collection Time   09/17/11  1:10 PM      Component Value Range   Squamous Epithelial / LPF FEW (*) RARE    WBC, UA 3-6  <3 (WBC/hpf)   RBC / HPF 3-6  <3 (RBC/hpf)   Bacteria, UA MANY (*) RARE    Imaging Studies:  IUP at [redacted]w[redacted]d: Best EDC 2012/01/30 by LMP CL 3.44cm; AFI: NL, Anatomy: NL, Ant placenta   Assessment: Preterm Contractions Fever of Unknown Etiology Probable UTI    Plan: Admit to OBs: Antenatal  IV ABX   Gabrille Kilbride E. 09/17/2011,2:52 PM

## 2011-09-18 ENCOUNTER — Encounter (HOSPITAL_COMMUNITY): Payer: Self-pay | Admitting: *Deleted

## 2011-09-18 DIAGNOSIS — R509 Fever, unspecified: Secondary | ICD-10-CM

## 2011-09-18 LAB — CBC
HCT: 27.7 % — ABNORMAL LOW (ref 36.0–46.0)
MCH: 31.8 pg (ref 26.0–34.0)
MCV: 89.9 fL (ref 78.0–100.0)
Platelets: 154 10*3/uL (ref 150–400)
RDW: 13.6 % (ref 11.5–15.5)

## 2011-09-18 MED ORDER — NIFEDIPINE ER 30 MG PO TB24
30.0000 mg | ORAL_TABLET | Freq: Every day | ORAL | Status: DC
  Administered 2011-09-18 – 2011-09-19 (×2): 30 mg via ORAL
  Filled 2011-09-18 (×4): qty 1

## 2011-09-18 NOTE — Progress Notes (Signed)
Patient continues to report feeling the contractions seen on the monitor, but states they are not strong enough for her to ask for pain medication. She does not feel nauseated or sick; she had a temp last night at 11:15 PM of 101.5, received tylenol and has not been febrile since. She reports no new complaints.  Filed Vitals:   09/18/11 0753  BP: 104/55  Pulse: 97  Temp: 98 F (36.7 C)  Resp: 20   Toco: shows contractions eery 4 minutes General: Awake, alert, oriented, smiling, no distress Heart: RRR, no murmur Lungs: CTA B/L Abd: +BS, soft, nontender Ext: no edema, reflexes 1+ B/L  Urine culture - pending  A/P 1. Presumed pyelonephritis: Cont rocephin 1 gram q12, monitor fever curve. Await cultures and can change to oral meds according to sensitivities. 2. Preterm Contractions: will start procardia 30mg  XR daily and continue continuous toco monitoring 3. IUP @ 24.2 wks: Cont routine prenatal care

## 2011-09-19 DIAGNOSIS — R509 Fever, unspecified: Secondary | ICD-10-CM

## 2011-09-19 LAB — URINE CULTURE

## 2011-09-19 NOTE — Progress Notes (Signed)
  FACULTY PRACTICE ANTEPARTUM(COMPREHENSIVE) NOTE  Amy Salazar is a 35 y.o. [phone] at [redacted]w[redacted]d who is admitted for pyelo.    Length of Stay:  2  Days  Subjective: feels well; denies pain, leak or bldg; no ctx     Vitals:  Blood pressure 88/41, pulse 90, temperature 97.9 F (36.6 C), temperature source Oral, resp. rate 18, height 5\' 3"  (1.6 m), weight 73.12 kg (161 lb 3.2 oz), last menstrual period 04/01/2011, SpO2 97.00%. Physical Examination:  General appearance - alert, well appearing, and in no distress and oriented to person, place, and time Fundal Height:  size equals dates Pelvic Exam:   examination not indicated Cervical Exam: Not evaluated.  Extremities: extremities normal, atraumatic, no cyanosis or edema  No CVA tenderness Fetal Monitoring:  NST reactive upon admission; currently only rare ctx per toco  Labs:  Urine cult: >100K E coli  Medications:  Scheduled    . cefTRIAXone (ROCEPHIN) IV  1 g Intravenous Q12H  . docusate sodium  100 mg Oral Daily  . NIFEdipine  30 mg Oral Daily  . prenatal vitamin w/FE, FA  1 tablet Oral Daily   I have reviewed the patient's current medications.  ASSESSMENT: Patient Active Problem List  Diagnoses  . Overweight (BMI 25.0-29.9)  . Amenorrhea  . Vaginal discharge  . HSV-2 infection  . Pregnant state, incidental  . Dysuria    PLAN: IUP at 24 wks Pyelo  Plan d/c most likely tomorrow if pt remains afeb  Divante Kotch 09/19/2011,7:51 AM

## 2011-09-19 NOTE — Progress Notes (Signed)
Interpreter to the bedside - RN asked pt. if she has any concerns, vaginal bleeding/discharge, feeling any pain, - pt. Stated, "No".  Also asked pt. If she is feeling baby move - pt. Stated, "yes". Family at the bedside. Pt. Smiling.

## 2011-09-19 NOTE — Progress Notes (Signed)
Shanda Bumps, Spanish interpreter at the bedside to translate - POC - concerns discussed.

## 2011-09-20 DIAGNOSIS — O2300 Infections of kidney in pregnancy, unspecified trimester: Secondary | ICD-10-CM

## 2011-09-20 DIAGNOSIS — R509 Fever, unspecified: Secondary | ICD-10-CM

## 2011-09-20 HISTORY — DX: Infections of kidney in pregnancy, unspecified trimester: O23.00

## 2011-09-20 MED ORDER — CEPHALEXIN 500 MG PO CAPS: 500.0000 mg | ORAL_CAPSULE | Freq: Three times a day (TID) | ORAL | Status: AC

## 2011-09-20 NOTE — Progress Notes (Signed)
UR chart review completed.  

## 2011-09-20 NOTE — Progress Notes (Signed)
09/20/2011 Amy Salazar  Interpreter  I assisted stephanie RN with discharge instructions.

## 2011-09-20 NOTE — Discharge Summary (Signed)
Physician Discharge Summary  Patient ID: Amy Salazar MRN: [id number] DOB/AGE: March 09, 1976 35 y.o.  Admit date: 09/17/2011 Discharge date: 09/20/2011  Admission Diagnoses:  SIUP @ [redacted]w[redacted]d                                          Pyelonephritis  Discharge Diagnoses:  SIUP @ [redacted]w[redacted]d Active Problems:  Pyelonephritis complicating pregnancy, antepartum   Discharged Condition: good  Hospital Course: Admitted on 09/17/11 with c/o contractions. Was found to have a urinary tract infection with a WBC of 20.2.  No fever at that time. Was given Procardia for contractions. FHR was stable and reassuring. [place] was WNL, size = dates.   This  Patient had not received prenatal care prior to admission. She was admitted to Antenatal and treated with Rocephin every 12 hrs.  She has had no fever for > 24 hrs and is deemed stable for discharge.  Has appointment at [facility] tomorrow to begin care.   Consults: none  Significant Diagnostic Studies: microbiology: urine culture: positive for e. Coli > 100,000col, sensitive Rocephin, resistant to Ampicillin.   Treatments: IV hydration and antibiotics: ceftriaxone  Discharge Exam: Blood pressure 115/57, pulse 79, temperature 98 F (36.7 C), temperature source Oral, resp. rate 18, height 5\' 3"  (1.6 m), weight 73.12 kg (161 lb 3.2 oz), last menstrual period 04/01/2011, SpO2 97.00%. General appearance: alert, cooperative and no distress Back: negative, symmetric, no curvature. ROM normal. No CVA tenderness. Resp: clear to auscultation bilaterally Chest wall: no tenderness Cardio: regular rate and rhythm, S1, S2 normal, no murmur, click, rub or gallop GI: soft, non-tender; bowel sounds normal; no masses,  no organomegaly Skin: Skin color, texture, turgor normal. No rashes or lesions  Disposition:   Discharge Orders    Future Appointments: Provider: Department: Dept Phone: Center:   09/21/2011 1:30 PM [facility] [phone]  [facility] [address]   09/28/2011 1:30 PM Ivy Tye [place] [facility] [phone] [facility]     Current Discharge Medication List    START taking these medications   Details  cephALEXin (KEFLEX) 500 MG capsule Take 1 capsule (500 mg total) by mouth 3 (three) times daily. Qty: 21 capsule, Refills: 0      CONTINUE these medications which have NOT CHANGED   Details  prenatal vitamin w/FE, FA (PRENATAL 1 + 1) 27-1 MG TABS Take 1 tablet by mouth daily.      acyclovir (ZOVIRAX) 400 MG tablet For 7 days Qty: 21 tablet, Refills: 0       Follow-up Information    Call [facility]. (As needed)          SignedWynelle Bourgeois 09/20/2011, 8:56 AM

## 2011-09-20 NOTE — Progress Notes (Addendum)
  S:  35 y.o. G P   At [redacted]w[redacted]d who has been treated for Pyelonephritis is doing well today.        Denies pain or fever.  O:   Filed Vitals:   09/20/11 0817  BP: 115/57  Pulse: 79  Temp: 98 F (36.7 C)  Resp: 18     Has been afebrile for > 24 hrs.  Lungs clear HR RRR, normal sounds Abdomen gravid, soft and nontender No CVAT FHR 130s Occasional contractions No bleeding  A:  IUP at 24.4 weeks      Pyelonephritis, e. Coli, sensitive to Ceftriaxone, improved      Afebrile >24 hours  P:  D/C home      Keflex X 7 days     RN will get interpretor for d/c instructions.      Follow up at [facility] clinic 09/28/11 at 1330  Labwork tomorrow at 1330

## 2011-09-21 ENCOUNTER — Other Ambulatory Visit: Payer: Self-pay

## 2011-09-21 DIAGNOSIS — Z331 Pregnant state, incidental: Secondary | ICD-10-CM

## 2011-09-21 LAB — HIV ANTIBODY (ROUTINE TESTING W REFLEX): HIV: NONREACTIVE

## 2011-09-21 NOTE — Progress Notes (Signed)
Prenatal labs done today Amy Salazar 

## 2011-09-22 LAB — OBSTETRIC PANEL
Eosinophils Absolute: 0.1 10*3/uL (ref 0.0–0.7)
Eosinophils Relative: 1 % (ref 0–5)
HCT: 26.9 % — ABNORMAL LOW (ref 36.0–46.0)
Hemoglobin: 9.2 g/dL — ABNORMAL LOW (ref 12.0–15.0)
Lymphocytes Relative: 22 % (ref 12–46)
Lymphs Abs: 1.4 10*3/uL (ref 0.7–4.0)
MCH: 30.9 pg (ref 26.0–34.0)
MCV: 90.3 fL (ref 78.0–100.0)
Monocytes Absolute: 0.6 10*3/uL (ref 0.1–1.0)
Monocytes Relative: 10 % (ref 3–12)
RBC: 2.98 MIL/uL — ABNORMAL LOW (ref 3.87–5.11)
Rh Type: POSITIVE
Rubella: >500 IU/mL — ABNORMAL HIGH
WBC: 6.3 10*3/uL (ref 4.0–10.5)

## 2011-09-22 LAB — SICKLE CELL SCREEN: Sickle Cell Screen: NEGATIVE

## 2011-09-28 ENCOUNTER — Encounter: Payer: Self-pay | Admitting: Family Medicine

## 2011-09-28 ENCOUNTER — Ambulatory Visit (INDEPENDENT_AMBULATORY_CARE_PROVIDER_SITE_OTHER): Payer: Self-pay | Admitting: Family Medicine

## 2011-09-28 VITALS — BP 98/60 | Wt 166.0 lb

## 2011-09-28 DIAGNOSIS — Z331 Pregnant state, incidental: Secondary | ICD-10-CM

## 2011-09-28 DIAGNOSIS — Z3480 Encounter for supervision of other normal pregnancy, unspecified trimester: Secondary | ICD-10-CM

## 2011-09-28 DIAGNOSIS — Z3490 Encounter for supervision of normal pregnancy, unspecified, unspecified trimester: Secondary | ICD-10-CM | POA: Insufficient documentation

## 2011-09-28 LAB — GLUCOSE, CAPILLARY: Glucose-Capillary: 142 mg/dL — ABNORMAL HIGH (ref 70–99)

## 2011-09-28 NOTE — Assessment & Plan Note (Signed)
Patient presents to first OB visit at 25 weeks and 4 days.  Risk factors for this pregnancy: Late prenatal care, history of genital herpes.  Advised patient to monitor for any herpetic outbreaks. Currently no active lesions.  Patient was seen in the [facility] for a urinary tract infection on September 17, 2011.  At that time, GC and Chlamydia were negative. Second trimester labs were reviewed.  PHQ 9 score: Zero  Will get one hour GTT today.  Preterm labor precautions and kick counts reviewed.  Patient to followup with Dr. Domenick Bookbinder or OB clinic in 4 weeks.

## 2011-09-28 NOTE — Progress Notes (Signed)
Patient presents to first OB visit at 25 weeks and 4 days. Risk factors for this pregnancy: Late prenatal care, history of genital herpes.  Advised patient to monitor for any herpetic outbreaks. Currently no active lesions. Patient was seen in the [facility] for a urinary tract infection on September 17, 2011. At that time, GC and Chlamydia were negative. Second trimester labs were reviewed. PHQ 9 score: Zero Will get one hour GTT today. Preterm labor precautions and kick counts reviewed.  Patient to followup with Dr. Domenick Bookbinder or OB clinic in 4 weeks.  Physical exam: Gen.: Obese, no acute distress Abdomen: Gravid, soft, nontender FH R.: 150s fundal height: 26 cm

## 2011-09-28 NOTE — Progress Notes (Signed)
Addended by: [place], Idus Rathke on: 09/28/2011 03:40 PM   Modules accepted: Orders

## 2011-09-28 NOTE — Patient Instructions (Signed)
Please schedule an appointment with Dr. Tye Savoy or [facility] clinic.  Amy Salazar - Systems analyst trimestre (Pregnancy - Third Trimester) El tercer trimestre del embarazo (los ltimos 3 meses) es el perodo de cambios ms rpidos que atraviesan usted y el beb. El aumento de peso es ms rpido. El beb alcanza un largo de aproximadamente 50 cm (20 pulgadas) y pesa entre 2,700 y 4,500 kg (6 a 10 libras). El beb gana ms tejido graso y ya est listo para la vida fuera del cuerpo de la [place]. Mientras estn en el interior, los bebs tienen perodos de sueo y vigilia, Warehouse manager y tienen hipo. Quizs sienta pequeas contracciones del tero. Este es el falso trabajo de [place]. Tambin se las conoce como contracciones de Braxton-Hicks. Es como una prctica del parto. Los problemas ms habituales de esta etapa del embarazo incluyen mayor dificultad para respirar, hinchazn de las manos y los pies por retencin de lquidos y la necesidad de Geographical information systems officer con ms frecuencia debido a que el tero y el beb presionan sobre la vejiga.  EXAMENES PRENATALES  Durante los [employer], deber seguir realizando pruebas de [place], segn avance el [place]. Estas pruebas se realizan para controlar su salud y la del beb. Tambin se realizan anlisis de sangre para [employer] niveles de [place]. La anemia (bajo nivel de hemoglobina) es frecuente durante el embarazo. Para prevenirla, se administran hierro y vitaminas. Tambin le harn nuevas pruebas para descartar la diabetes. Podrn repetirle algunas de las [employer] hicieron previamente.   En cada visita le medirn el tamao del tero. Es para asegurarse de que el beb se desarrolla correctamente.   Tambin en cada visita la pesarn. Esto se realiza para asegurarse de que aumenta de peso al ritmo indicado y que usted y su beb evolucionan normalmente.   En algunas ocasiones se realiza una ecografa para confirmar el correcto desarrollo y evolucin del beb. Esta  prueba se realiza con ondas sonoras inofensivas para el beb, de modo que el profesional pueda calcular con ms precisin la fecha del [place].   Discuta las posibilidades de la anestesia si necesita cesrea.  Algunas veces se realizan pruebas especializadas del lquido amnitico que rodea al beb. Esta prueba se denomina amniocentesis. El lquido amnitico se obtiene introduciendo una aguja en el abdomen (vientre). En ocasiones se lleva a cabo cerca del final del embarazo, si es Optician, dispensing. En este caso se realiza para asegurarse de que los pulmones del beb estn lo suficientemente maduros como para que pueda vivir fuera del tero. CAMBIOS QUE OCURREN EN EL TERCER TRIMESTRE DEL EMBARAZO Su organismo atravesar diferentes cambios durante el embarazo que varan de Neomia Dear persona a Educational psychologist. Converse con el profesional que la asiste acerca los cambios que usted note y que la preocupen.  Durante el ltimo trimestre probablemente sienta un aumento del apetito. Es normal tener "antojos" de Development worker, community. Esto vara de Neomia Dear persona a otra y de un embarazo a Therapist, art.   Podrn aparecer las primeras estras en las caderas, abdomen y [place]. Estos son cambios normales del cuerpo durante el [place]. No existen medicamentos ni ejercicios que puedan prevenir [employer].   El estreimiento puede tratarse con un laxante o agregando fibra a su dieta. Beber grandes cantidades de lquidos, tomar fibras en forma de verduras, frutas y granos integrales es de [place].   Tambin es beneficioso practicar actividad fsica. Si ha sido una persona activa hasta el [place], podr continuar con la [place] de  las [employer]. Si ha sido [employer], puede ser beneficioso que comience con un programa de ejercicios, Museum/gallery exhibitions officer. Consulte con el profesional que la asiste antes de comenzar un programa de ejercicios.   Evite el consumo de cigarrillos, el alcohol, los medicamentos no  prescritos y las "drogas de la calle" durante el [place]. Estas sustancias qumicas afectan la formacin y el desarrollo del beb. Evite estas sustancias durante todo el embarazo para asegurar el nacimiento de un beb sano.   Dolor de espalda, venas varicosas y hemorroides podran aparecer o empeorar.   Los movimientos del beb pueden ser ms bruscos y aparecer ms a menudo.   Puede que note dificultades para respirar facilmente.   El ombligo podra salrsele hacia afuera.   Puede segregar un lquido amarillento (calostro) de las [place].   Puede segregar mucus con sangre. Esto normalmente ocurre unos [address] a una semana antes de que comience el [place].  INSTRUCCIONES PARA EL CUIDADO DOMICILIARIO  La mayor parte de los cuidados que se aconsejan son los mismos que los indicados para las primeras etapas del Psychiatrist. Es importante que concurra a todas las citas con el profesional y siga sus instrucciones con Camera operator a los medicamentos que deba Chemical engineer, a la actividad fsica y a Psychologist, forensic.   Durante el embarazo debe obtener nutrientes para usted y para su beb. Consuma alimentos balanceados a intervalos regulares. Elija alimentos como carne, pescado, [place] y otros productos lcteos descremados, verduras, frutas, panes integrales y cereales. El Equities trader cul es el aumento de peso ideal.   Las relaciones sexuales pueden continuarse hasta casi el final del embarazo, si no se presentan otros problemas como prdida prematura (antes de tiempo) de lquido amnitico, hemorragia vaginal o dolor abdominal (en el vientre).   Realice [employer], si no tiene restricciones. Consulte con el profesional que la asiste si no sabe con certeza si determinados ejercicios son seguros. El mayor aumento de peso se produce [employer] ltimos trimestres del [place].   Haga reposo con frecuencia, con las piernas elevadas, o segn lo necesite para evitar los calambres y el  dolor de cintura.   Use un buen sostn o como los que se usan para hacer deportes para Paramedic la sensibilidad de las [place]. Tambin puede serle til si lo [place] mientras duerme. Si pierde Product manager, podr [employer].   No utilice la baera con agua caliente, baos turcos y saunas.   Colquese el cinturn de seguridad cuando conduzca. Este la proteger a usted y al beb en caso de accidente.   Evite comer carne cruda y el contacto con los utensilios y desperdicios de los gatos. Estos elementos contienen grmenes que pueden causar defectos de nacimiento en el beb.   Es fcil perder algo de orina durante el [place]. Apretar y Chief Operating Officer los msculos de la pelvis la ayudar con este problema. Practique detener la miccin cuando est en el bao. Estos son los mismos msculos que Development worker, international aid. Son [employer] mismos msculos que utiliza cuando trata de [employer] gases. Puede practicar apretando estos msculos [employer], y repetir esto tres veces por da aproximadamente. Una vez que conozca qu msculos debe contraer, no realice estos ejercicios durante la miccin. Puede favorecerle una infeccin si la orina vuelve hacia atrs.   Pida ayuda si tiene necesidades econmicas, de asesoramiento o nutricionales durante el [place]. El profesional podr ayudarla con respecto a estas necesidades, o derivarla  a otros especialistas.   Practique la ida [facility] a modo de [place].   Tome clases prenatales junto con su pareja para comprender, practicar y hacer preguntas acerca del Aleen Campi de parto y el nacimiento.   Prepare la habitacin del beb.   No viaje fuera de la ciudad a menos que sea absolutamente necesario y con el consejo del mdico.   Use slo zapatos bajos sin taco para tener un mejor equilibrio y prevenir cadas.  EL CONSUMO DE MEDICAMENTOS Y DROGAS DURANTE EL EMBARAZO  Contine tomando las vitaminas apropiadas para esta etapa tal como se le indic. Las  vitaminas deben contener un miligramo de cido flico y deben suplementarse con hierro. Guarde todas las vitaminas fuera del alcance de los nios. La ingestin de slo un par de vitaminas o comprimidos que contengan hierro pueden ocasionar la [employer] en un beb o en un nio pequeo.   Evite el uso de [place], inclusive los de venta [place], que no hayan sido prescritos o indicados por el profesional que la asiste. Algunos medicamentos pueden causar problemas fsicos al beb. Utilice los medicamentos de venta libre o de prescripcin para Chief Technology Officer, Environmental health practitioner o la [place], segn se lo indique el profesional que lo asiste. No utilice aspirina, ibuprofeno (Motrin, Advil, Nuprin) o naproxeno (Aleve) a menos que el profesional la autorice.   El alcohol se asocia a cierto nmero de defectos del nacimiento, incluido el sndrome de alcoholismo fetal. Debe evitar el consumo de alcohol en cualquiera de sus formas. El cigarrillo causa nacimientos prematuros y bebs de bajo peso al nacer. Las drogas de la calle son muy nocivas para el beb y estn absolutamente prohibidas. Un beb que nace de [employer], ser adicto al nacer. Ese beb tendr los mismos sntomas de abstinencia que un adulto.   Infrmele al profesional si consume alguna droga.  SOLICITE ATENCIN MDICA SI: Tiene alguna preocupacin Academic librarian. Es mejor que llame para formular las preguntas si no puede esperar hasta la prxima visita, que sentirse preocupada por ellas.  DECISIONES ACERCA DE LA CIRCUNCISIN Usted puede saber o no cul es el sexo de su beb. Si es un varn, ste es el momento de pensar acerca de la circuncisin. La circuncisin es la extirpacin del prepucio. Esta es la piel que cubre el extremo sensible del pene. No hay un motivo mdico que lo justifique. Generalmente la decisin se toma segn lo que sea popular en ese momento, o se basa en creencias religiosas. Podr conversar estos temas con el profesional que la  asiste. SOLICITE ATENCIN MDICA DE INMEDIATO SI:  La temperatura oral se eleva sin motivo por encima de 102 F (38.9 C) o segn le indique el profesional que la asiste.   Tiene una prdida de lquido por la vagina (canal de parto). Si sospecha una ruptura de las [place], tmese la temperatura y llame al profesional para informarlo sobre esto.   Observa unas pequeas manchas, una hemorragia vaginal o elimina cogulos. Avsele al profesional acerca de la cantidad y de cuntos apsitos est utilizando.   Presenta un olor desagradable en la secrecin vaginal y observa un cambio en el color, de transparente a blanco.   Ha vomitado durante ms de 24 horas.   Presenta escalofros o fiebre.   Comienza a sentir falta de aire.   Siente ardor al Beatrix Shipper.   [place] o sube ms de 900 g (ms de 2 libras), o segn lo indicado por el profesional que la asiste. Anola Gurney  que sbitamente se le hinchan el rostro, las manos, los pies o las piernas.   Presenta dolor abdominal. Las molestias en el ligamento redondo son Neomia Dear causa benigna (no cancerosa) frecuente de Engineer, mining abdominal durante el Psychiatrist, pero el profesional que la asiste deber evaluarlo.   Presenta dolor de cabeza intenso que no se [place].   Si no siente los movimientos del beb durante ms de tres horas. Si piensa que el beb no se mueve tanto como lo haca habitualmente, coma algo que Psychologist, clinical y [employer] lado izquierdo durante [place]. El beb debe moverse al menos 4  5 veces por hora. Comunquese inmediatamente si el beb se mueve menos que lo indicado.   Se cae, se ve involucrada en un accidente automovilstico o sufre algn tipo de traumatismo.   En su hogar hay violencia mental o fsica.  Document Released: 08/18/2005 Document Revised: 07/21/2011 [facility] Patient Information 2012 [place], [place].

## 2011-10-26 ENCOUNTER — Ambulatory Visit (INDEPENDENT_AMBULATORY_CARE_PROVIDER_SITE_OTHER): Payer: Self-pay | Admitting: Family Medicine

## 2011-10-26 VITALS — BP 112/64 | Temp 98.8°F | Wt 165.0 lb

## 2011-10-26 DIAGNOSIS — Z3480 Encounter for supervision of other normal pregnancy, unspecified trimester: Secondary | ICD-10-CM

## 2011-10-26 DIAGNOSIS — Z3490 Encounter for supervision of normal pregnancy, unspecified, unspecified trimester: Secondary | ICD-10-CM

## 2011-10-26 MED ORDER — ACYCLOVIR 400 MG PO TABS: 400.0000 mg | ORAL_TABLET | Freq: Two times a day (BID) | ORAL | Status: AC

## 2011-10-26 NOTE — Progress Notes (Signed)
Patient presents to first OB visit at 29.5 weeks. Complains of itchy lesions, hx of herpes: will treat with Acyclovir 400 BID until delivery. 1 hr GTT was 142 - patient scheduled to return to lab for 3 hr. Preterm labor precautions and kick counts reviewed.  Birth control: desires BTL, but still applying for medicaid. Plans to breast and bottle feed. Patient to followup with Dr. Domenick Bookbinder or OB clinic in 4 weeks.

## 2011-10-26 NOTE — Patient Instructions (Signed)
Take Acyclovir 400 mg PO BID for 1 month. Return to clinic in 4 weeks.  Amy Salazar - Systems analyst trimestre (Pregnancy - Third Trimester) El tercer trimestre del embarazo (los ltimos 3 meses) es el perodo de cambios ms rpidos que atraviesan usted y el beb. El aumento de peso es ms rpido. El beb alcanza un largo de aproximadamente 50 cm (20 pulgadas) y pesa entre 2,700 y 4,500 kg (6 a 10 libras). El beb gana ms tejido graso y ya est listo para la vida fuera del cuerpo de la [place]. Mientras estn en el interior, los bebs tienen perodos de sueo y vigilia, Warehouse manager y tienen hipo. Quizs sienta pequeas contracciones del tero. Este es el falso trabajo de [place]. Tambin se las conoce como contracciones de Braxton-Hicks. Es como una prctica del parto. Los problemas ms habituales de esta etapa del embarazo incluyen mayor dificultad para respirar, hinchazn de las manos y los pies por retencin de lquidos y la necesidad de Geographical information systems officer con ms frecuencia debido a que el tero y el beb presionan sobre la vejiga.  EXAMENES PRENATALES  Durante los [employer], deber seguir realizando pruebas de [place], segn avance el [place]. Estas pruebas se realizan para controlar su salud y la del beb. Tambin se realizan anlisis de sangre para [employer] niveles de [place]. La anemia (bajo nivel de hemoglobina) es frecuente durante el embarazo. Para prevenirla, se administran hierro y vitaminas. Tambin le harn nuevas pruebas para descartar la diabetes. Podrn repetirle algunas de las [employer] hicieron previamente.   En cada visita le medirn el tamao del tero. Es para asegurarse de que el beb se desarrolla correctamente.   Tambin en cada visita la pesarn. Esto se realiza para asegurarse de que aumenta de peso al ritmo indicado y que usted y su beb evolucionan normalmente.   En algunas ocasiones se realiza una ecografa para confirmar el correcto desarrollo y evolucin del beb.  Esta prueba se realiza con ondas sonoras inofensivas para el beb, de modo que el profesional pueda calcular con ms precisin la fecha del [place].   Discuta las posibilidades de la anestesia si necesita cesrea.  Algunas veces se realizan pruebas especializadas del lquido amnitico que rodea al beb. Esta prueba se denomina amniocentesis. El lquido amnitico se obtiene introduciendo una aguja en el abdomen (vientre). En ocasiones se lleva a cabo cerca del final del embarazo, si es Optician, dispensing. En este caso se realiza para asegurarse de que los pulmones del beb estn lo suficientemente maduros como para que pueda vivir fuera del tero. CAMBIOS QUE OCURREN EN EL TERCER TRIMESTRE DEL EMBARAZO Su organismo atravesar diferentes cambios durante el embarazo que varan de Neomia Dear persona a Educational psychologist. [place] con el profesional que la asiste acerca los cambios que usted note y que la preocupen.  Durante el ltimo trimestre probablemente sienta un aumento del apetito. Es normal tener "antojos" de Development worker, community. Esto vara de Neomia Dear persona a otra y de un embarazo a Therapist, art.   Podrn aparecer las primeras estras en las caderas, abdomen y [place]. Estos son cambios normales del cuerpo durante el [place]. No existen medicamentos ni ejercicios que puedan prevenir [employer].   El estreimiento puede tratarse con un laxante o agregando fibra a su dieta. Beber grandes cantidades de lquidos, tomar fibras en forma de verduras, frutas y granos integrales es de [place].   Tambin es beneficioso practicar actividad fsica. Si ha sido una persona activa hasta el [place], podr continuar con  la [employer] de las actividades durante el mismo. Si ha sido [employer], puede ser beneficioso que comience con un programa de ejercicios, Museum/gallery exhibitions officer. Consulte con el profesional que la asiste antes de comenzar un programa de ejercicios.   Evite el consumo de cigarrillos, el alcohol, los medicamentos no  prescritos y las "drogas de la calle" durante el [place]. Estas sustancias qumicas afectan la formacin y el desarrollo del beb. Evite estas sustancias durante todo el embarazo para asegurar el nacimiento de un beb sano.   Dolor de espalda, venas varicosas y hemorroides podran aparecer o empeorar.   Los movimientos del beb pueden ser ms bruscos y aparecer ms a menudo.   Puede que note dificultades para respirar facilmente.   El ombligo podra salrsele hacia afuera.   Puede segregar un lquido amarillento (calostro) de las [place].   Puede segregar mucus con sangre. Esto normalmente ocurre unos [address] a una semana antes de que comience el [place].  INSTRUCCIONES PARA EL CUIDADO DOMICILIARIO  La mayor parte de los cuidados que se aconsejan son los mismos que los indicados para las primeras etapas del Psychiatrist. Es importante que concurra a todas las citas con el profesional y siga sus instrucciones con Camera operator a los medicamentos que deba Chemical engineer, a la actividad fsica y a Psychologist, forensic.   Durante el embarazo debe obtener nutrientes para usted y para su beb. Consuma alimentos balanceados a intervalos regulares. Elija alimentos como carne, pescado, [place] y otros productos lcteos descremados, verduras, frutas, panes integrales y cereales. El Equities trader cul es el aumento de peso ideal.   Las relaciones sexuales pueden continuarse hasta casi el final del embarazo, si no se presentan otros problemas como prdida prematura (antes de tiempo) de lquido amnitico, hemorragia vaginal o dolor abdominal (en el vientre).   Realice [employer], si no tiene restricciones. Consulte con el profesional que la asiste si no sabe con certeza si determinados ejercicios son seguros. El mayor aumento de peso se produce [employer] ltimos trimestres del [place].   Haga reposo con frecuencia, con las piernas elevadas, o segn lo necesite para evitar los calambres y el  dolor de cintura.   Use un buen sostn o como los que se usan para hacer deportes para Paramedic la sensibilidad de las [place]. Tambin puede serle til si lo [place] mientras duerme. Si pierde Product manager, podr [employer].   No utilice la baera con agua caliente, baos turcos y saunas.   Colquese el cinturn de seguridad cuando conduzca. Este la proteger a usted y al beb en caso de accidente.   Evite comer carne cruda y el contacto con los utensilios y desperdicios de los gatos. Estos elementos contienen grmenes que pueden causar defectos de nacimiento en el beb.   Es fcil perder algo de orina durante el [place]. Apretar y Chief Operating Officer los msculos de la pelvis la ayudar con este problema. Practique detener la miccin cuando est en el bao. Estos son los mismos msculos que Development worker, international aid. Son [employer] mismos msculos que utiliza cuando trata de [employer] gases. Puede practicar apretando estos msculos [employer], y repetir esto tres veces por da aproximadamente. Una vez que conozca qu msculos debe contraer, no realice estos ejercicios durante la miccin. Puede favorecerle una infeccin si la orina vuelve hacia atrs.   Pida ayuda si tiene necesidades econmicas, de asesoramiento o nutricionales durante el [place]. El profesional podr ayudarla con respecto a estas  necesidades, o derivarla a otros especialistas.   Practique la ida [facility] hospital a modo de [place].   Tome clases prenatales junto con su pareja para comprender, practicar y hacer preguntas acerca del Aleen Campi de parto y el nacimiento.   Prepare la habitacin del beb.   No viaje fuera de la ciudad a menos que sea absolutamente necesario y con el consejo del mdico.   Use slo zapatos bajos sin taco para tener un mejor equilibrio y prevenir cadas.  EL CONSUMO DE MEDICAMENTOS Y DROGAS DURANTE EL EMBARAZO  Contine tomando las vitaminas apropiadas para esta etapa tal como se le indic. Las  vitaminas deben contener un miligramo de cido flico y deben suplementarse con hierro. Guarde todas las vitaminas fuera del alcance de los nios. La ingestin de slo un par de vitaminas o comprimidos que contengan hierro pueden ocasionar la [employer] en un beb o en un nio pequeo.   Evite el uso de [place], inclusive los de venta [place], que no hayan sido prescritos o indicados por el profesional que la asiste. Algunos medicamentos pueden causar problemas fsicos al beb. Utilice los medicamentos de venta libre o de prescripcin para Chief Technology Officer, Environmental health practitioner o la [place], segn se lo indique el profesional que lo asiste. No utilice aspirina, ibuprofeno (Motrin, Advil, Nuprin) o naproxeno (Aleve) a menos que el profesional la autorice.   El alcohol se asocia a cierto nmero de defectos del nacimiento, incluido el sndrome de alcoholismo fetal. Debe evitar el consumo de alcohol en cualquiera de sus formas. El cigarrillo causa nacimientos prematuros y bebs de bajo peso al nacer. Las drogas de la calle son muy nocivas para el beb y estn absolutamente prohibidas. Un beb que nace de [employer], ser adicto al nacer. Ese beb tendr los mismos sntomas de abstinencia que un adulto.   Infrmele al profesional si consume alguna droga.  SOLICITE ATENCIN MDICA SI: Tiene alguna preocupacin Academic librarian. Es mejor que llame para formular las preguntas si no puede esperar hasta la prxima visita, que sentirse preocupada por ellas.  DECISIONES ACERCA DE LA CIRCUNCISIN Usted puede saber o no cul es el sexo de su beb. Si es un varn, ste es el momento de pensar acerca de la circuncisin. La circuncisin es la extirpacin del prepucio. Esta es la piel que cubre el extremo sensible del pene. No hay un motivo mdico que lo justifique. Generalmente la decisin se toma segn lo que sea popular en ese momento, o se basa en creencias religiosas. Podr conversar estos temas con el profesional que la  asiste. SOLICITE ATENCIN MDICA DE INMEDIATO SI:  La temperatura oral se eleva sin motivo por encima de 102 F (38.9 C) o segn le indique el profesional que la asiste.   Tiene una prdida de lquido por la vagina (canal de parto). Si sospecha una ruptura de las [place], tmese la temperatura y llame al profesional para informarlo sobre esto.   Observa unas pequeas manchas, una hemorragia vaginal o elimina cogulos. Avsele al profesional acerca de la cantidad y de cuntos apsitos est utilizando.   Presenta un olor desagradable en la secrecin vaginal y observa un cambio en el color, de transparente a blanco.   Ha vomitado durante ms de 24 horas.   Presenta escalofros o fiebre.   Comienza a sentir falta de aire.   Siente ardor al Beatrix Shipper.   [place] o sube ms de 900 g (ms de 2 libras), o segn lo indicado por el profesional que  la asiste. Observa que sbitamente se le hinchan el rostro, las manos, los pies o las piernas.   Presenta dolor abdominal. Las molestias en el ligamento redondo son Neomia Dear causa benigna (no cancerosa) frecuente de Engineer, mining abdominal durante el Psychiatrist, pero el profesional que la asiste deber evaluarlo.   Presenta dolor de cabeza intenso que no se [place].   Si no siente los movimientos del beb durante ms de tres horas. Si piensa que el beb no se mueve tanto como lo haca habitualmente, coma algo que Psychologist, clinical y [employer] lado izquierdo durante [place]. El beb debe moverse al menos 4  5 veces por hora. Comunquese inmediatamente si el beb se mueve menos que lo indicado.   Se cae, se ve involucrada en un accidente automovilstico o sufre algn tipo de traumatismo.   En su hogar hay violencia mental o fsica.  Document Released: 08/18/2005 Document Revised: 07/21/2011 [facility] Patient Information 2012 [place], [place].

## 2011-11-02 ENCOUNTER — Other Ambulatory Visit: Payer: Self-pay

## 2011-11-02 DIAGNOSIS — Z331 Pregnant state, incidental: Secondary | ICD-10-CM

## 2011-11-02 LAB — GLUCOSE, CAPILLARY: Glucose-Capillary: 78 mg/dL (ref 70–99)

## 2011-11-02 NOTE — Progress Notes (Signed)
3 hr gtt done today Amy Salazar 

## 2011-11-03 LAB — GLUCOSE TOLERANCE, 3 HOURS
Glucose Tolerance, 2 hour: 110 mg/dL (ref 70–164)
Glucose Tolerance, Fasting: 72 mg/dL (ref 70–104)
Glucose, GTT - 3 Hour: 73 mg/dL (ref 70–144)

## 2011-11-10 ENCOUNTER — Ambulatory Visit: Payer: Self-pay | Admitting: Family Medicine

## 2011-11-11 ENCOUNTER — Ambulatory Visit (INDEPENDENT_AMBULATORY_CARE_PROVIDER_SITE_OTHER): Payer: Self-pay | Admitting: Family Medicine

## 2011-11-11 VITALS — BP 103/68 | Wt 169.0 lb

## 2011-11-11 DIAGNOSIS — Z3480 Encounter for supervision of other normal pregnancy, unspecified trimester: Secondary | ICD-10-CM

## 2011-11-11 DIAGNOSIS — N760 Acute vaginitis: Secondary | ICD-10-CM

## 2011-11-11 DIAGNOSIS — N898 Other specified noninflammatory disorders of vagina: Secondary | ICD-10-CM

## 2011-11-11 LAB — POCT WET PREP (WET MOUNT)
Trichomonas Wet Prep HPF POC: NEGATIVE
WBC, Wet Prep HPF POC: >20

## 2011-11-11 MED ORDER — FLUCONAZOLE 150 MG PO TABS
150.0000 mg | ORAL_TABLET | Freq: Once | ORAL | Status: AC
Start: 2011-11-11 — End: 2011-11-12

## 2011-11-11 NOTE — Patient Instructions (Signed)
Thank you for coming in today.    

## 2011-11-11 NOTE — Progress Notes (Signed)
G100 P33 35 year old female presenting to clinic today for routine OB visit at [redacted] weeks gestational age. Today she complains of vaginal irritation with thick white discharge. This is been present for about 2 or 3 weeks. She denies any pain fevers chills vaginal bleeding and feels well otherwise. Exam:  BP 103/68  Wt 169 lb (76.658 kg)  LMP 04/01/2011 Gen: Well NAD HEENT: EOMI,  MMM Lungs: CTABL Nl WOB Heart: RRR no MRG Abd: NABS, NT, 32 weeks belly GYN: Erythematous appearing vaginal canal with thick white discharge present normal cervix appearing A/P: Yeast infection on wet prep. Plan to treat with fluconazole. GC/CHL obtained and pending.   1 hr GTT was 142 - patient scheduled to return to lab for 3 hr.  Preterm labor precautions and kick counts reviewed.  Birth control: desires BTL, but still applying for medicaid.  Plans to breast and bottle feed.  Patient to followup with Dr. Domenick Bookbinder or OB clinic in 2 weeks

## 2011-11-12 LAB — GC/CHLAMYDIA PROBE AMP, GENITAL
Chlamydia, DNA Probe: NEGATIVE
GC Probe Amp, Genital: NEGATIVE

## 2011-11-23 NOTE — L&D Delivery Note (Signed)
Delivery Note At 11:24 AM a viable female was delivered via Vaginal, Spontaneous Delivery (Presentation: vertex  ).  APGAR: 9, 9; weight 6 lb 11.2 oz (3039 g).   Placenta status: , Spontaneous.  Cord: 3 vessels with the following complications: None.   Anesthesia: None, IV pain medications only Episiotomy: None Lacerations: None Suture Repair: N/A Est. Blood Loss (mL): 200  Mom to postpartum.  Baby to nursery-stable.  Mother desires BTL.  Will ask interpreter to confirm.  She needs to sign BTL forms. She plans to breast and bottle feed. Unsure of circumcision status.  DE LA CRUZ,IVY 12/27/2011, 11:40 AM   I was presented for birth and agree with note above. [facility]

## 2011-12-01 ENCOUNTER — Ambulatory Visit (INDEPENDENT_AMBULATORY_CARE_PROVIDER_SITE_OTHER): Payer: Self-pay | Admitting: Family Medicine

## 2011-12-01 VITALS — BP 121/74 | Wt 170.0 lb

## 2011-12-01 DIAGNOSIS — Z3480 Encounter for supervision of other normal pregnancy, unspecified trimester: Secondary | ICD-10-CM

## 2011-12-01 DIAGNOSIS — Z3490 Encounter for supervision of normal pregnancy, unspecified, unspecified trimester: Secondary | ICD-10-CM

## 2011-12-01 NOTE — Patient Instructions (Signed)
Return to clinic in 2 weeks.  Herpes y Psychiatrist (Herpes and Pregnancy) El herpes genital (VHS) es una enfermedad de trasmisin sexual por virus que puede ser muy grave durante el Psychiatrist. Una vez que se contagia el VHS el virus permanece en el organismo.. Puede volver luego de sufrir cualquier tipo de estrs fsico o mental. Hay dos tipos de HSV:  VHS 1 - La infeccin generalmente se localiza en los labios y en la boca.   VHS 2-La infeccin se localiza en la vagina, en los genitales externos y el ano.  Sin embargo ambos tipos pueden Physicist, medical parte del cuerpo. La primera vez que se produce la infeccin por VHS es Conservation officer, nature. Hay ms llagas y ampollas, lo que resulta doloroso y dura mucho tiempo.  El mayor riesgo para el feto se produce durante la primera infeccin (infeccin primaria). Tambin hay un riesgo elevado cuando la infeccin reaparece (recurrencia). El parto vaginal en una mujer infectada con herpes y llagas genitales activas hace que el recin nacido resulte infectado en casi la mitad o ms de las veces. An practicando una operacin cesrea (nacimiento realizando [place]) alrededor del 5% de los bebs resultan infectados. En especial si la bolsa de aguas (saco amnitico) se ha roto dos o ms horas antes del [place]. CAUSAS El VHS es causado y diseminado:  A travs de una herida en la piel durante el contacto sexual con una persona que tiene VHS.   Por tocar una llaga o ampolla de VHS y posteriormente tocar alguna parte de su cuerpo.   Aunque no tenga llagas o sntomas.  Estas son algunas cosas que pueden causar Neomia Dear recurrencia del VHS:  Estrs mental.   [place] resistencia fsica debido a un resfro o infeccin.   Durante el ciclo menstrual.   Ciertas enfermedades.   Infeccin en la vagina por hongos.   Sequedad y Engineer, mining en la vagina durante el acto sexual.  [place].   Escalofros   Flujo vaginal.   Dolor en la vagina y en los genitales, sensacin  de picazn y ardor.   Dolor de [place].   Dolores musculares.   Dolor al Beatrix Shipper.   Ganglios hinchados en el rea de ingle.   En casos raros, la persona no tiene sntomas o no sabe que tiene la infeccin.  DIAGNSTICO  Se realiza un cultivo de las llagas y [place].   Se indican anlisis de sangre para verificar si existen anticuerpos para los virus.  TRATAMIENTO Una vez que se contrae el VHS, no hay tratamiento para curar la infeccin. Se implementa un tratamiento para los sntomas. Hay medicamentos que H[employer]el Foods sntomas y disminuir la extensin de dicha infeccin. Si a menudo usted tiene brotes, hay un medicamento que puede ser tomado diariamente para Air traffic controller. Hay medicamentos seguros para usted y su beb en caso de embarazo. Pueden aliviar los sntomas, la recurrencia o prevenir una infeccin por VHS. Las mujeres embarazadas con historia de VHS pueden tomar el medicamento en las ltimas cuatro a seis semanas de Psychiatrist. Esto puede evitar la infeccin durante el parto. Si la bolsa amnitica se ha roto [employer] horas o ms o existe una infeccin activa por VHS, el mtodo ms seguro de nacimiento para el beb es la cesrea. Las mujeres que se han infectado con VHS antes del embarazo desarrollan anticuerpos contra el virus que pueden proteger al beb. Las mujeres infectadas con VHS en el rea genital pueden amamantar a su bebe (el  virus no est presente en la [employer] Pal[employer]). El bebe no debe ser amamantado si la infeccin esta en el pecho. EFECTOS EN EL FETO INFECTADO POR HERPES La mitad o ms de los recin nacidos infectados mueren. Las tres cuartas partes de los que sobreviven tienen serios problemas oculares, neurolgicos y Art gallery manager. Si la infeccin por VHS ocurre por primera vez en Financial risk analyst trimestre del Psychiatrist, puede ocurrir un aborto. PARTO El beb no debera nacer por el canal del parto infectado activamente debido a los graves problemas que pueden  sucederle a un beb con herpes. Un canal de parto con llagas activas (lesiones) pueden observarse en el examen fsico. Un canal de parto aparentemente normal tambin puede contener el virus. Realizar un cultivo antes del parto no asegura que el canal de parto no pueda contagiar el virus durante el nacimiento. El parto vaginal es an recomendado si las lesiones activas no son evidentes. Deber practicarse una cesrea si hay dudas con respecto a una infeccin por VHS en la zona genital. INSTRUCCIONES PARA EL CUIDADO DOMICILIARIO  Tome todos los medicamentos indicados por su mdico.   No mantenga relaciones sexuales mientras tenga una infeccin por VHS.   Use preservativo entre las infecciones.   Use ropa interior de algodn.   Mantenga secas y limpias las reas infectadas.   Lvese las manos con jabn y agua caliente despus de tocar las reas infectadas de VHS de su cuerpo.   Trate de evitar problemas fsicos y situaciones estresantes que puedan favorecer este tipo de infeccin.   No use polvos, aerosoles ni jabones desodorantes en las zonas afectadas.   Comunquele a su pareja sexual si sufre una infeccin por VHS.  SOLICITE ATENCIN MDICA DE INMEDIATO SI:  Tiene una infeccin de VHS y no puede orinar.   Tiene una infeccin de VHS durante cualquier momento de su embarazo, especialmente en los ltimos 3 meses.   Observa llagas y ampollas en la zona genital y piensa que es una infeccin por VHS.   Sospecha que sufre alguna reaccin alrgica o efectos secundarios debido a los medicamentos que toma.  Document Released: 08/18/2005 Document Revised: 07/21/2011 [facility] Patient Information 2012 [place], [place].

## 2011-12-01 NOTE — Progress Notes (Signed)
Patient presents at 34.[redacted] weeks gestation. States that yeast infection has resolved s/p Diflucan. Denies any active herpetic lesions at this time. Passed 3 hr GTT. Plans to breast and bottle feed. Desires BTL after this delivery. PE: Vaginal inspection: external - no active, open lesions, no erythema or evidence of yeast Plan: Continue Acyclovir BID until delivery. Follow up in 2 weeks. Red flags, preterm labor precautions, kick counts reviewed.

## 2011-12-15 ENCOUNTER — Ambulatory Visit (INDEPENDENT_AMBULATORY_CARE_PROVIDER_SITE_OTHER): Payer: Self-pay | Admitting: Family Medicine

## 2011-12-15 VITALS — BP 104/62 | Wt 172.2 lb

## 2011-12-15 DIAGNOSIS — Z3490 Encounter for supervision of normal pregnancy, unspecified, unspecified trimester: Secondary | ICD-10-CM

## 2011-12-15 DIAGNOSIS — Z3480 Encounter for supervision of other normal pregnancy, unspecified trimester: Secondary | ICD-10-CM

## 2011-12-15 LAB — POCT WET PREP (WET MOUNT)
Trichomonas Wet Prep HPF POC: NEGATIVE
WBC, Wet Prep HPF POC: >20

## 2011-12-15 MED ORDER — ACYCLOVIR 400 MG PO TABS: 400.0000 mg | ORAL_TABLET | Freq: Three times a day (TID) | ORAL | Status: DC

## 2011-12-15 NOTE — Progress Notes (Signed)
Patient presents at 36.[redacted] weeks gestation.  Denies any active herpetic lesions at this time - has been on acyclovir 400 BID since second trimester. Will increase Acyclovir to 400 mg TID. GBS/GC/Chlamydia and wet prep performed today. Plans to breast and bottle feed.  Desires BTL after this delivery.  Labor precautions and kick counts reviewed. Follow up in 1 week until delivery. Needs BTL forms completed.

## 2011-12-15 NOTE — Patient Instructions (Signed)
Take Acyclovir 400 mg by mouth every 8 hours until you deliver baby. Schedule follow up appointment in one week.  Vanetta Mulders - Systems analyst trimestre (Pregnancy - Third Trimester) El tercer trimestre del embarazo (los ltimos 3 meses) es el perodo de cambios ms rpidos que atraviesan usted y el beb. El aumento de peso es ms rpido. El beb alcanza un largo de aproximadamente 50 cm (20 pulgadas) y pesa entre 2,700 y 4,500 kg (6 a 10 libras). El beb gana ms tejido graso y ya est listo para la vida fuera del cuerpo de la [place]. Mientras estn en el interior, los bebs tienen perodos de sueo y vigilia, Warehouse manager y tienen hipo. Quizs sienta pequeas contracciones del tero. Este es el falso trabajo de [place]. Tambin se las conoce como contracciones de Braxton-H[place]. Es como una prctica del parto. Los problemas ms habituales de esta etapa del embarazo incluyen mayor dificultad para respirar, hinchazn de las manos y los pies por retencin de lquidos y la necesidad de Geographical information systems officer con ms frecuencia debido a que el tero y el beb presionan sobre la vejiga.  EXAMENES PRENATALES  Durante los [employer]power Inc, deber seguir realizando pruebas de [place], segn avance el [place]. Estas pruebas se realizan para controlar su salud y la del beb. Tambin se realizan anlisis de sangre para [employer] niveles de [place]. La anemia (bajo nivel de hemoglobina) es frecuente durante el embarazo. Para prevenirla, se administran hierro y vitaminas. Tambin le harn nuevas pruebas para descartar la diabetes. Podrn repetirle algunas de las Hovnanian Enterprises hicieron previamente.   En cada visita le medirn el tamao del tero. Es para asegurarse de que el beb se desarrolla correctamente.   Tambin en cada visita la pesarn. Esto se realiza para asegurarse de que aumenta de peso al ritmo indicado y que usted y su beb evolucionan normalmente.   En algunas ocasiones se realiza una ecografa para confirmar el  correcto desarrollo y evolucin del beb. Esta prueba se realiza con ondas sonoras inofensivas para el beb, de modo que el profesional pueda calcular con ms precisin la fecha del [place].   Discuta las posibilidades de la anestesia si necesita cesrea.  Algunas veces se realizan pruebas especializadas del lquido amnitico que rodea al beb. Esta prueba se denomina amniocentesis. El lquido amnitico se obtiene introduciendo una aguja en el abdomen (vientre). En ocasiones se lleva a cabo cerca del final del embarazo, si es Optician, dispensing. En este caso se realiza para asegurarse de que los pulmones del beb estn lo suficientemente maduros como para que pueda vivir fuera del tero. CAMBIOS QUE OCURREN EN EL TERCER TRIMESTRE DEL EMBARAZO Su organismo atravesar diferentes cambios durante el embarazo que varan de Neomia Dear persona a Educational psychologist. Converse con el profesional que la asiste acerca los cambios que usted note y que la preocupen.  Durante el ltimo trimestre probablemente sienta un aumento del apetito. Es normal tener "antojos" de Development worker, community. Esto vara de Neomia Dear persona a otra y de un embarazo a Therapist, art.   Podrn aparecer las primeras estras en las caderas, abdomen y Dodge. Estos son cambios normales del cuerpo durante el [place]. No existen medicamentos ni ejercicios que puedan prevenir CarMax.   El estreimiento puede tratarse con un laxante o agregando fibra a su dieta. Beber grandes cantidades de lquidos, tomar fibras en forma de verduras, frutas y granos integrales es de [place].   Tambin es beneficioso practicar actividad fsica. Si ha sido una persona Print production planner  el Psychiatrist, podr continuar con la Harley[employer]son de las actividades durante el mismo. Si ha sido [employer], puede ser beneficioso que comience con un programa de ejercicios, Museum/gallery exhibitions officer. Consulte con el profesional que la asiste antes de comenzar un programa de ejercicios.   Evite el consumo de  cigarrillos, el alcohol, los medicamentos no prescritos y las "drogas de la calle" durante el [place]. Estas sustancias qumicas afectan la formacin y el desarrollo del beb. Evite estas sustancias durante todo el embarazo para asegurar el nacimiento de un beb sano.   Dolor de espalda, venas varicosas y hemorroides podran aparecer o empeorar.   Los movimientos del beb pueden ser ms bruscos y aparecer ms a menudo.   Puede que note dificultades para respirar facilmente.   El ombligo podra salrsele hacia afuera.   Puede segregar un lquido amarillento (calostro) de las [place].   Puede segregar mucus con sangre. Esto normalmente ocurre unos [address] a una semana antes de que comience el [place] de [place].  INSTRUCCIONES PARA EL CUIDADO DOMICILIARIO  La mayor parte de los cuidados que se aconsejan son los mismos que los indicados para las primeras etapas del Psychiatrist. Es importante que concurra a todas las citas con el profesional y siga sus instrucciones con Camera operator a los medicamentos que deba Chemical engineer, a la actividad fsica y a Psychologist, forensic.   Durante el embarazo debe obtener nutrientes para usted y para su beb. Consuma alimentos balanceados a intervalos regulares. Elija alimentos como carne, pescado, [place] y otros productos lcteos descremados, verduras, frutas, panes integrales y cereales. El Equities trader cul es el aumento de peso ideal.   Las relaciones sexuales pueden continuarse hasta casi el final del embarazo, si no se presentan otros problemas como prdida prematura (antes de tiempo) de lquido amnitico, hemorragia vaginal o dolor abdominal (en el vientre).   Realice Tesoro Corporation, si no tiene restricciones. Consulte con el profesional que la asiste si no sabe con certeza si determinados ejercicios son seguros. El mayor aumento de peso se produce [employer]ocker ltimos trimestres del [place].   Haga reposo con frecuencia, con las piernas elevadas, o  segn lo necesite para evitar los calambres y el dolor de cintura.   Use un buen sostn o como los que se usan para hacer deportes para Paramedic la sensibilidad de las [place]. Tambin puede serle til si lo [place] mientras duerme. Si pierde Product manager, podr [employer] Hann[employer]in.   No utilice la baera con agua caliente, baos turcos y saunas.   Colquese el cinturn de seguridad cuando conduzca. Este la proteger a usted y al beb en caso de accidente.   Evite comer carne cruda y el contacto con los utensilios y desperdicios de los gatos. Estos elementos contienen grmenes que pueden causar defectos de nacimiento en el beb.   Es fcil perder algo de orina durante el [place]. Apretar y Chief Operating Officer los msculos de la pelvis la ayudar con este problema. Practique detener la miccin cuando est en el bao. Estos son los mismos msculos que Development worker, international aid. Son [employer] Partners mismos msculos que utiliza cuando trata de Ryder System gases. Puede practicar apretando estos msculos WellPoint, y repetir esto tres veces por da aproximadamente. Una vez que conozca qu msculos debe contraer, no realice estos ejercicios durante la miccin. Puede favorecerle una infeccin si la orina vuelve hacia atrs.   Pida ayuda si tiene necesidades econmicas, de asesoramiento o nutricionales durante el [place]. El profesional podr  ayudarla con respecto a estas necesidades, o derivarla a otros especialistas.   Practique la ida [employer] hospital a modo de [place].   Tome clases prenatales junto con su pareja para comprender, practicar y hacer preguntas acerca del Aleen Campi de parto y el nacimiento.   Prepare la habitacin del beb.   No viaje fuera de la ciudad a menos que sea absolutamente necesario y con el consejo del mdico.   Use slo zapatos bajos sin taco para tener un mejor equilibrio y prevenir cadas.  EL CONSUMO DE MEDICAMENTOS Y DROGAS DURANTE EL EMBARAZO  Contine tomando las vitaminas apropiadas  para esta etapa tal como se le indic. Las vitaminas deben contener un miligramo de cido flico y deben suplementarse con hierro. Guarde todas las vitaminas fuera del alcance de los nios. La ingestin de slo un par de vitaminas o comprimidos que contengan hierro pueden ocasionar la Newmont Mining en un beb o en un nio pequeo.   Evite el uso de [place], inclusive los de venta [place], que no hayan sido prescritos o indicados por el profesional que la asiste. Algunos medicamentos pueden causar problemas fsicos al beb. Utilice los medicamentos de venta libre o de prescripcin para Chief Technology Officer, Environmental health practitioner o la [place], segn se lo indique el profesional que lo asiste. No utilice aspirina, ibuprofeno (Motrin, Advil, Nuprin) o naproxeno (Aleve) a menos que el profesional la autorice.   El alcohol se asocia a cierto nmero de defectos del nacimiento, incluido el sndrome de alcoholismo fetal. Debe evitar el consumo de alcohol en cualquiera de sus formas. El cigarrillo causa nacimientos prematuros y bebs de bajo peso al nacer. Las drogas de la calle son muy nocivas para el beb y estn absolutamente prohibidas. Un beb que nace de American Express, ser adicto al nacer. Ese beb tendr los mismos sntomas de abstinencia que un adulto.   Infrmele al profesional si consume alguna droga.  SOLICITE ATENCIN MDICA SI: Tiene alguna preocupacin Academic librarian. Es mejor que llame para formular las preguntas si no puede esperar hasta la prxima visita, que sentirse preocupada por ellas.  DECISIONES ACERCA DE LA CIRCUNCISIN Usted puede saber o no cul es el sexo de su beb. Si es un varn, ste es el momento de pensar acerca de la circuncisin. La circuncisin es la extirpacin del prepucio. Esta es la piel que cubre el extremo sensible del pene. No hay un motivo mdico que lo justifique. Generalmente la decisin se toma segn lo que sea popular en ese momento, o se basa en creencias religiosas. Podr  conversar estos temas con el profesional que la asiste. SOLICITE ATENCIN MDICA DE INMEDIATO SI:  La temperatura oral se eleva sin motivo por encima de 102 F (38.9 C) o segn le indique el profesional que la asiste.   Tiene una prdida de lquido por la vagina (canal de parto). Si sospecha una ruptura de las [place], tmese la temperatura y llame al profesional para informarlo sobre esto.   Observa unas pequeas manchas, una hemorragia vaginal o elimina cogulos. Avsele al profesional acerca de la cantidad y de cuntos apsitos est utilizando.   Presenta un olor desagradable en la secrecin vaginal y observa un cambio en el color, de transparente a blanco.   Ha vomitado durante ms de 24 horas.   Presenta escalofros o fiebre.   Comienza a sentir falta de aire.   Siente ardor al Beatrix Shipper.   [place] o sube ms de 900 g (ms de 2 libras), o segn lo  indicado por el profesional que la asiste. Observa que sbitamente se le hinchan el rostro, las manos, los pies o las piernas.   Presenta dolor abdominal. Las molestias en el ligamento redondo son Neomia Dear causa benigna (no cancerosa) frecuente de Engineer, mining abdominal durante el Psychiatrist, pero el profesional que la asiste deber evaluarlo.   Presenta dolor de cabeza intenso que no se [place].   Si no siente los movimientos del beb durante ms de tres horas. Si piensa que el beb no se mueve tanto como lo haca habitualmente, coma algo que Psychologist, clinical y Target Corporation lado izquierdo durante [place]. El beb debe moverse al menos 4  5 veces por hora. Comunquese inmediatamente si el beb se mueve menos que lo indicado.   Se cae, se ve involucrada en un accidente automovilstico o sufre algn tipo de traumatismo.   En su hogar hay violencia mental o fsica.  Document Released: 08/18/2005 Document Revised: 07/21/2011 [facility] Patient Information 2012 [place], [place].

## 2011-12-20 LAB — CULTURE, BETA STREP (GROUP B ONLY)

## 2011-12-22 ENCOUNTER — Ambulatory Visit (INDEPENDENT_AMBULATORY_CARE_PROVIDER_SITE_OTHER): Payer: Self-pay | Admitting: Family Medicine

## 2011-12-22 VITALS — BP 107/66 | Temp 97.7°F | Wt 173.6 lb

## 2011-12-22 DIAGNOSIS — Z3480 Encounter for supervision of other normal pregnancy, unspecified trimester: Secondary | ICD-10-CM

## 2011-12-22 DIAGNOSIS — Z3490 Encounter for supervision of normal pregnancy, unspecified, unspecified trimester: Secondary | ICD-10-CM

## 2011-12-22 NOTE — Patient Instructions (Signed)
Return to clinic 12/28/2011 @ 10:00 am.  Ihor Dow y parto normal (Normal Labor and Delivery) En Restaurant manager, fast food, su mdico debe estar seguro de que usted est en trabajo de [place]. Algunos signos son:  Puede haber eliminado el "tapn mucoso" antes que comience el trabajo de [place]. Se trata de una pequea cantidad de mucus con sangre.   Tiene contracciones uterinas regulares.   El [employer] las contracciones se acorta.   Las molestias y Chief Technology Officer se hacen gradualmente ms intensos.   El dolor se ubica principalmente en la espalda.   Los dolores empeoran al [employer].   El cuello del tero (la apertura del tero se hace ms delgada, comienza a borrarse, y se abre (se dilata).  Una vez que se encuentre en [place] Bumpers parto y sea admitida en el hospital, el mdico har lo siguiente:  Un examen fsico completo.   Controlar sus signos vitales (presin arterial, pulso, temperatura y la frecuencia cardaca fetal).   Realizar un examen vaginal (usando un guante estril y lubricante para determinar:   La posicin (presentacin) del beb (ceflica [vertex] o nalgas primero).   El nivel (plano) de la cabeza del beb en el canal de parto.   El borramiento y dilatacin del cuello del tero.   Le rasurarn el vello pbico y le aplicarn una enema segn lo considere el mdico y las circunstancias.   Generalmente se coloca un monitor electrnico sobre el abdomen. El monitor sigue la duracin e intensidad de las contracciones, as como la frecuencia cardaca del beb.   Generalmente, el profesional inserta una va intravenosa en el brazo para administrarle agua azucarada. Esta es una medida de precaucin, de modo que puedan administrarle rpidamente medicamentos durante el [place].  EL TRABAJO DE PARTO Y PARTO NORMALES SE DIVIDEN EN 3 ETAPAS: Primera etapa Comienzan las contracciones regulares y el cuello comienza a borrarse y dilatarse. Esta etapa puede durar entre 3 y 15 horas. El  final de la primera etapa se considera cuando el cuello est borrado en un 100% y se ha dilatado 10 cm. Le administrarn analgsicos por:  Inyeccin (morfina, demerol, etc.).   Anestesia regional (espinal, caudal o epidural, anestsicos colocados en diferentes regiones de la columna vertebral). Podrn administrarle medicamentos para el dolor en la regin paracervical, que consiste en la aplicacin de un anestsico inyectable en cada uno de los lados del cuello del tero.  La embarazada puede requerir un "parto natural" , es decir no recibir [employer] o anestesia durante el [place] de parto y [place]. Segunda etapa En este momento el beb baja a travs del canal de parto (vagina) y nace. Esto puede durar entre 1 y 4 horas. A medida que el beb asoma la cabeza por el canal de parto, podr sentir una sensacin similar a cuando mueve el intestino. Sentir el impulse de empujar con fuerza hasta que el nio salga. A medida que la cabecita baja, el mdico decidir si realiza una episiotoma (corte en el perineo y rea de la vagina) para evitar la ruptura de los tejidos). Luego del nacimiento del beb y la expulsin de la placenta, la episiotoma se sutura. En algunos casos se coloca a la madre una mscara con xido nitroso para Research officer, political party respiracin y Engineer, materials. El final de la etapa 2 se produce cuando el beb ha salido completamente. Luego, cuando el cordn umbilical deja de pulsar, se pinza y se corta. Tercera etapa La tercera etapa comienza luego que el  beb ha nacido y finaliza luego de la expulsin de la placenta. Generalmente esto lleva entre 5 y 30 minutos. Luego de la expulsin de la placenta, le aplicarn un medicamento por va intravenosa para ayudar a Engineer, materials y Psychiatric nurse. En la tercera etapa no hay dolor y generalmente no son necesarios los analgsicos. Si le han realizado una episiotoma, es el momento de Sales promotion account executive. Luego del parto, la mam es observada y controlada  exhaustivamente durante 1  2 horas para verificar que no hay sangrado en el post parto (hemorragias). Si pierde [employer], le administrarn un medicamento para Engineer, manufacturing tero y Comptroller. Document Released: 10/21/2008 Document Revised: 07/21/2011 [facility] Patient Information 2012 [place], [place].

## 2011-12-22 NOTE — Progress Notes (Signed)
Patient is currently 37.[redacted] weeks pregnant. Complains of scant vaginal bleeding after intercourse only.   Bright red spotting followed by bloody mucous.   Good fetal movement, Braxton Hicks (20 min. Apart). PLAN: GBS POSITIVE. Hx of Genital Herpes - continue Acyclovir TID. Breast and bottle feeding. Desires BTL. RTC in one week.

## 2011-12-27 ENCOUNTER — Inpatient Hospital Stay (HOSPITAL_COMMUNITY)
Admission: AD | Admit: 2011-12-27 | Discharge: 2011-12-29 | DRG: 775 | Disposition: A | Discharge status: Home / Self Care | Payer: Medicaid Other | Source: Ambulatory Visit | Attending: Family Medicine | Admitting: Family Medicine

## 2011-12-27 ENCOUNTER — Encounter (HOSPITAL_COMMUNITY): Payer: Self-pay | Admitting: *Deleted

## 2011-12-27 ENCOUNTER — Encounter (HOSPITAL_COMMUNITY): Payer: Self-pay | Admitting: Anesthesiology

## 2011-12-27 DIAGNOSIS — IMO0001 Reserved for inherently not codable concepts without codable children: Secondary | ICD-10-CM

## 2011-12-27 DIAGNOSIS — O09529 Supervision of elderly multigravida, unspecified trimester: Secondary | ICD-10-CM

## 2011-12-27 LAB — CBC
HCT: 31.2 % — ABNORMAL LOW (ref 36.0–46.0)
MCHC: 33.7 g/dL (ref 30.0–36.0)
Platelets: 190 10*3/uL (ref 150–400)
RDW: 13.5 % (ref 11.5–15.5)

## 2011-12-27 LAB — RPR: RPR Ser Ql: NONREACTIVE

## 2011-12-27 MED ORDER — WITCH HAZEL-GLYCERIN EX PADS
1.0000 "application " | MEDICATED_PAD | CUTANEOUS | Status: DC | PRN
  Administered 2011-12-27: 1 via TOPICAL

## 2011-12-27 MED ORDER — BENZOCAINE-MENTHOL 20-0.5 % EX AERO
1.0000 "application " | INHALATION_SPRAY | CUTANEOUS | Status: DC | PRN
  Administered 2011-12-27: 1 via TOPICAL

## 2011-12-27 MED ORDER — TETANUS-DIPHTH-ACELL PERTUSSIS 5-2.5-18.5 LF-MCG/0.5 IM SUSP
0.5000 mL | Freq: Once | INTRAMUSCULAR | Status: AC
  Administered 2011-12-28: 0.5 mL via INTRAMUSCULAR
  Filled 2011-12-27: qty 0.5

## 2011-12-27 MED ORDER — LANOLIN HYDROUS EX OINT: TOPICAL_OINTMENT | CUTANEOUS | Status: DC | PRN

## 2011-12-27 MED ORDER — ONDANSETRON HCL 4 MG PO TABS: 4.0000 mg | ORAL_TABLET | ORAL | Status: DC | PRN

## 2011-12-27 MED ORDER — ACETAMINOPHEN 325 MG PO TABS: 650.0000 mg | ORAL_TABLET | ORAL | Status: DC | PRN

## 2011-12-27 MED ORDER — IBUPROFEN 600 MG PO TABS
600.0000 mg | ORAL_TABLET | Freq: Four times a day (QID) | ORAL | Status: DC
  Administered 2011-12-27 – 2011-12-29 (×8): 600 mg via ORAL
  Filled 2011-12-27 (×8): qty 1

## 2011-12-27 MED ORDER — SODIUM CHLORIDE 0.9 % IJ SOLN: 3.0000 mL | INTRAMUSCULAR | Status: DC | PRN

## 2011-12-27 MED ORDER — LACTATED RINGERS IV SOLN
INTRAVENOUS | Status: DC
  Administered 2011-12-27: 09:00:00 via INTRAVENOUS

## 2011-12-27 MED ORDER — CITRIC ACID-SODIUM CITRATE 334-500 MG/5ML PO SOLN: 30.0000 mL | ORAL | Status: DC | PRN

## 2011-12-27 MED ORDER — DIPHENHYDRAMINE HCL 50 MG/ML IJ SOLN: 12.5000 mg | INTRAMUSCULAR | Status: DC | PRN

## 2011-12-27 MED ORDER — DIBUCAINE 1 % RE OINT: 1.0000 "application " | TOPICAL_OINTMENT | RECTAL | Status: DC | PRN

## 2011-12-27 MED ORDER — SIMETHICONE 80 MG PO CHEW: 80.0000 mg | CHEWABLE_TABLET | ORAL | Status: DC | PRN

## 2011-12-27 MED ORDER — ONDANSETRON HCL 4 MG/2ML IJ SOLN: 4.0000 mg | Freq: Four times a day (QID) | INTRAMUSCULAR | Status: DC | PRN

## 2011-12-27 MED ORDER — ONDANSETRON HCL 4 MG/2ML IJ SOLN: 4.0000 mg | INTRAMUSCULAR | Status: DC | PRN

## 2011-12-27 MED ORDER — BENZOCAINE-MENTHOL 20-0.5 % EX AERO
INHALATION_SPRAY | CUTANEOUS | Status: AC
  Filled 2011-12-27: qty 56

## 2011-12-27 MED ORDER — ZOLPIDEM TARTRATE 5 MG PO TABS: 5.0000 mg | ORAL_TABLET | Freq: Every evening | ORAL | Status: DC | PRN

## 2011-12-27 MED ORDER — NALBUPHINE SYRINGE 5 MG/0.5 ML
INJECTION | INTRAMUSCULAR | Status: AC
  Administered 2011-12-27: 5 mg via INTRAVENOUS
  Filled 2011-12-27: qty 0.5

## 2011-12-27 MED ORDER — LIDOCAINE HCL (PF) 1 % IJ SOLN: 30.0000 mL | INTRAMUSCULAR | Status: DC | PRN

## 2011-12-27 MED ORDER — SENNOSIDES-DOCUSATE SODIUM 8.6-50 MG PO TABS
2.0000 | ORAL_TABLET | Freq: Every day | ORAL | Status: DC
  Administered 2011-12-27 – 2011-12-28 (×2): 2 via ORAL

## 2011-12-27 MED ORDER — LACTATED RINGERS IV SOLN: 500.0000 mL | INTRAVENOUS | Status: DC | PRN

## 2011-12-27 MED ORDER — SODIUM CHLORIDE 0.9 % IV SOLN
2.0000 g | Freq: Once | INTRAVENOUS | Status: AC
  Administered 2011-12-27: 2 g via INTRAVENOUS
  Filled 2011-12-27: qty 2000

## 2011-12-27 MED ORDER — OXYCODONE-ACETAMINOPHEN 5-325 MG PO TABS: 1.0000 | ORAL_TABLET | ORAL | Status: DC | PRN

## 2011-12-27 MED ORDER — SODIUM CHLORIDE 0.9 % IJ SOLN: 3.0000 mL | Freq: Two times a day (BID) | INTRAMUSCULAR | Status: DC

## 2011-12-27 MED ORDER — NALBUPHINE SYRINGE 5 MG/0.5 ML
5.0000 mg | INJECTION | INTRAMUSCULAR | Status: DC | PRN
  Administered 2011-12-27: 5 mg via INTRAVENOUS
  Filled 2011-12-27: qty 0.5

## 2011-12-27 MED ORDER — INFLUENZA VIRUS VACC SPLIT PF IM SUSP
0.5000 mL | INTRAMUSCULAR | Status: AC
  Administered 2011-12-28: 0.5 mL via INTRAMUSCULAR
  Filled 2011-12-27: qty 0.5

## 2011-12-27 MED ORDER — LACTATED RINGERS IV SOLN: 500.0000 mL | Freq: Once | INTRAVENOUS | Status: DC

## 2011-12-27 MED ORDER — EPHEDRINE 5 MG/ML INJ: 10.0000 mg | INTRAVENOUS | Status: DC | PRN

## 2011-12-27 MED ORDER — PRENATAL MULTIVITAMIN CH
1.0000 | ORAL_TABLET | Freq: Every day | ORAL | Status: DC
  Administered 2011-12-27 – 2011-12-29 (×3): 1 via ORAL
  Filled 2011-12-27 (×3): qty 1

## 2011-12-27 MED ORDER — DIPHENHYDRAMINE HCL 25 MG PO CAPS: 25.0000 mg | ORAL_CAPSULE | Freq: Four times a day (QID) | ORAL | Status: DC | PRN

## 2011-12-27 MED ORDER — PHENYLEPHRINE 40 MCG/ML (10ML) SYRINGE FOR IV PUSH (FOR BLOOD PRESSURE SUPPORT): 80.0000 ug | PREFILLED_SYRINGE | INTRAVENOUS | Status: DC | PRN

## 2011-12-27 MED ORDER — SODIUM CHLORIDE 0.9 % IV SOLN: 250.0000 mL | INTRAVENOUS | Status: DC | PRN

## 2011-12-27 MED ORDER — FLEET ENEMA 7-19 GM/118ML RE ENEM: 1.0000 | ENEMA | RECTAL | Status: DC | PRN

## 2011-12-27 MED ORDER — IBUPROFEN 600 MG PO TABS: 600.0000 mg | ORAL_TABLET | Freq: Four times a day (QID) | ORAL | Status: DC | PRN

## 2011-12-27 MED ORDER — FENTANYL 2.5 MCG/ML BUPIVACAINE 1/10 % EPIDURAL INFUSION (WH - ANES): 14.0000 mL/h | INTRAMUSCULAR | Status: DC

## 2011-12-27 MED ORDER — OXYTOCIN 20 UNITS IN LACTATED RINGERS INFUSION - SIMPLE: 125.0000 mL/h | Freq: Once | INTRAVENOUS | Status: DC

## 2011-12-27 MED ORDER — OXYTOCIN BOLUS FROM INFUSION
500.0000 mL | Freq: Once | INTRAVENOUS | Status: DC
  Filled 2011-12-27: qty 500
  Filled 2011-12-27: qty 1000

## 2011-12-27 NOTE — H&P (Signed)
Chart reviewed and agree with management and plan.  

## 2011-12-27 NOTE — Progress Notes (Signed)
Amy Salazar in for triage. Patient states frequent contractions, no bleeding or leaking and reports good fetal movement.

## 2011-12-27 NOTE — H&P (Signed)
Amy Salazar is a 36 y.o. female presenting for labor evaluation.  Spanish translator present for history taking.  She sees the family practice clinic for prenatal care.  She reports good fetal movement and denies LOF, vaginal bleeding, problems with this or her previous pregnancies.  She has a history of HSV, with no current outbreak/prodromal symptoms.  Maternal Medical History:  Reason for admission: Reason for admission: contractions.  Reason for Admission:   nauseaContractions: Onset was 1-2 hours ago.   Frequency: regular.   Duration is approximately 1 minute.   Perceived severity is strong.    Fetal activity: Perceived fetal activity is normal.   Last perceived fetal movement was within the past hour.    Prenatal complications: no prenatal complications Prenatal Complications - Diabetes: none.    OB History    Grav Para Term Preterm Abortions TAB SAB Ect Mult Living   4 3 3  0 0 0 0 0 0 3     Past Medical History  Diagnosis Date  . Herpes    Past Surgical History  Procedure Date  . Appendectomy     pt was 36 yo   Family History: family history is not on file. Social History:  reports that she has never smoked. She has never used smokeless tobacco. She reports that she does not drink alcohol or use illicit drugs.  Review of Systems  Constitutional: Negative.   Eyes: Negative for blurred vision.  Respiratory: Negative for cough and shortness of breath.   Cardiovascular: Negative.   Gastrointestinal: Negative for nausea and vomiting.  Genitourinary: Negative for dysuria and urgency.  Musculoskeletal: Negative.   Skin: Negative.   Neurological: Negative for dizziness and headaches.  Psychiatric/Behavioral: Negative.     Dilation: 5 Effacement (%): 80 Station: -2 Exam by:: Amy Salazar, CNM Blood pressure 127/62, pulse 61, temperature 97.7 F (36.5 C), temperature source Oral, resp. rate 20, last menstrual period 04/01/2011. Maternal Exam:  Uterine Assessment:  Contraction strength is moderate.  Contraction duration is 70 seconds. Contraction frequency is regular.   Abdomen: Patient reports no abdominal tenderness. Fetal presentation: vertex  Introitus: Normal vulva. Normal vagina.    Fetal Exam Fetal Monitor Review: Mode: ultrasound.   Variability: moderate (6-25 bpm).   Pattern: no decelerations.    Fetal State Assessment: Category I - tracings are normal.     Physical Exam  Constitutional: She is oriented to person, place, and time. She appears well-developed and well-nourished.  Neck: Normal range of motion.  GI: Soft.  Genitourinary: There is no lesion on the right labia. There is no lesion on the left labia.       Cervix 5/80/-2, soft, posterior  Vertex  Musculoskeletal: Normal range of motion.  Neurological: She is alert and oriented to person, place, and time. She has normal reflexes.  Skin: Skin is warm and dry.  Psychiatric: She has a normal mood and affect. Her behavior is normal. Judgment and thought content normal.    Prenatal labs: ABO, Rh: O/POS/-- (10/30 1331) Antibody: NEG (10/30 1331) Rubella: >500.0 (10/30 1331) RPR: NON REAC (10/30 1331)  HBsAg: NEGATIVE (10/30 1331)  HIV: NON REACTIVE (10/30 1331)  GBS:   Postiive  Assessment/Plan: A: Active labor Negative for HSV outbreak or symptoms  P: Admit to labor and delivery Phone call and page to Dr. Tye Savoy at [phone]. No answer at this time. Start GBS prophylaxis as soon as possible Pt desires IV pain medication for labor  LEFTWICH-KIRBY, Amy Salazar 12/27/2011, 9:15 AM

## 2011-12-27 NOTE — Progress Notes (Signed)
[facility] Department of Clinical Social Work Documentation of Interpretation   I assisted __Ellen RN_________________ with interpretation of ___admission___________________ for this patient.

## 2011-12-27 NOTE — Progress Notes (Signed)
Pt in for labor eval, reports ucs since 0600/  Denies any bleeding or lof.

## 2011-12-27 NOTE — Progress Notes (Signed)
[facility] Department of Clinical Social Work Documentation of Interpretation   I assisted _Donna RN & Kelly RN__________________ with interpretation of ____questions__________________ for this patient.

## 2011-12-27 NOTE — Anesthesia Preprocedure Evaluation (Deleted)

## 2011-12-28 ENCOUNTER — Encounter: Payer: Self-pay | Admitting: Family Medicine

## 2011-12-28 MED ORDER — IBUPROFEN 600 MG PO TABS: 600.0000 mg | ORAL_TABLET | Freq: Four times a day (QID) | ORAL | Status: AC | PRN

## 2011-12-28 NOTE — Progress Notes (Signed)
UR chart review completed.  

## 2011-12-28 NOTE — Discharge Summary (Signed)
Attestation of Attending Supervision of Resident: Evaluation and management procedures were performed by the [facility] Resident under my supervision.  I have reviewed the resident's note, chart reviewed and agree with management and plan.  Jaynie Collins, M.D. 12/28/2011 11:26 AM

## 2011-12-28 NOTE — Progress Notes (Signed)
Post Partum Day 1  Subjective: no complaints, up ad lib, voiding, tolerating PO and + flatus  Objective: Blood pressure 106/63, pulse 63, temperature 98 F (36.7 C), temperature source Oral, resp. rate 18, height 5\' 3"  (1.6 m), weight 78.472 kg (173 lb), last menstrual period 04/01/2011, unknown if currently breastfeeding.  Physical Exam:  General: alert, cooperative and no distress Lochia: appropriate Uterine Fundus: firm DVT Evaluation: No evidence of DVT seen on physical exam. Negative Homan's sign.   Basename 12/27/11 0931  HGB 10.5*  HCT 31.2*    Assessment/Plan: Discharge home, Breastfeeding and Contraception desires BTL. No circumcision   LOS: 1 day   D. Piloto [employer]. MD PGY-1 12/28/2011, 7:59 AM

## 2011-12-28 NOTE — Discharge Summary (Signed)
Obstetric Discharge Summary Reason for Admission: onset of labor Prenatal Procedures: ultrasound Intrapartum Procedures: spontaneous vaginal delivery and GBS prophylaxis Postpartum Procedures: none Complications-Operative and Postpartum: none Hemoglobin  Date Value Range Status  12/27/2011 10.5* 12.0-15.0 (g/dL) Final     HCT  Date Value Range Status  12/27/2011 31.2* 36.0-46.0 (%) Final    Discharge Diagnoses: Term Pregnancy-delivered  Discharge Information: Date: 12/28/2011 Activity: unrestricted Diet: routine Medications: Ibuprofen Condition: stable Instructions: refer to practice specific booklet Discharge to: home Follow-up Information    Follow up with [facility]. (haga Neomia Dear cita en 6 semanas.)    Contact information:   [address] [place] [address] [phone]         Newborn Data: Live born female  Birth Weight: 6 lb 11.2 oz (3039 g) APGAR: 9, 9  Home with mother.   D. Piloto Sherron Flemings Paz. MD PGY-1 12/28/2011, 11:24 AM

## 2012-01-07 ENCOUNTER — Encounter: Payer: Self-pay | Admitting: Family Medicine

## 2012-02-11 ENCOUNTER — Ambulatory Visit (INDEPENDENT_AMBULATORY_CARE_PROVIDER_SITE_OTHER): Payer: Self-pay | Admitting: Family Medicine

## 2012-02-11 ENCOUNTER — Encounter: Payer: Self-pay | Admitting: Family Medicine

## 2012-02-11 VITALS — BP 113/70 | HR 55 | Temp 98.4°F | Wt 153.7 lb

## 2012-02-11 DIAGNOSIS — Z3490 Encounter for supervision of normal pregnancy, unspecified, unspecified trimester: Secondary | ICD-10-CM

## 2012-02-11 NOTE — Progress Notes (Signed)
  Subjective:     Amy Salazar is a 36 y.o. female who presents for a postpartum visit. She is 6 weeks postpartum following a spontaneous vaginal delivery. I have fully reviewed the prenatal and intrapartum course. The delivery was at term gestational weeks. Outcome: spontaneous vaginal delivery. Anesthesia: IV sedation. Postpartum course has been normal. Baby's course has been great. Baby is feeding by bottle - [employer] Good Start. Bleeding no bleeding. Bowel function is normal. Bladder function is normal. Patient is sexually active. Contraception method is condoms. Postpartum depression screening: negative.  Denies any thick discharge, foul odor.  She has been going out and returning to normal activities of daily living.  The following portions of the patient's history were reviewed and updated as appropriate: allergies, current medications, past medical history and past social history.  Review of Systems Pertinent items are noted in HPI.   Objective:    BP 113/70  Pulse 55  Temp(Src) 98.4 F (36.9 C) (Oral)  Wt 153 lb 11.2 oz (69.718 kg)  General:  alert, cooperative and no distress  Lungs: clear to auscultation bilaterally  Heart:  regular rate and rhythm, S1, S2 normal, no murmur, click, rub or gallop  Abdomen: soft, non-tender; bowel sounds normal; no masses,  no organomegaly   Vulva:  normal  Vagina: normal vagina  Cervix:  no cervical motion tenderness  Adnexa:  no mass, fullness, tenderness        Assessment:    6 postpartum exam. Pap smear not done at today's visit.   Plan:    1. Contraception: condoms.  Patient says she is not currently sexually active.  She plans to use condoms.  I encouraged another form of birth control if patient becomes sexually active in the future.  She can return to clinic anytime to discuss her options.  2. No signs of depression.  Good support system.  Patient says she is happy and well-organized, and relaxed.  3. Follow up as needed.

## 2012-02-11 NOTE — Patient Instructions (Signed)
Cuidados luego de un parto por vía vaginal °(Postpartum Care After Vaginal Delivery) °Luego del nacimiento del bebé deberá permanecer en el hospital durante 24 a 72 horas, excepto que hubiera existido algún problema, o usted sufra alguna enfermedad. Mientras se encuentre en el hospital recibirá ayuda e instrucciones por parte de las enfermeras y el médico, quienes cuidarán de usted y su bebé y le darán consejos para amamantarlo correctamente, especialmente si es el primer hijo.  °En caso de ser necesario, le prescribirán analgésicos. Observará una pequeña hemorragia vaginal y deberá cambiar los apósitos con frecuencia. Lávese las manos cuidadosamente con agua y jabón durante al menos 20 segundos luego de cambiarse el apósito o ir al baño. Si elimina coágulos o aumenta la hemorragia, infórmelo a la enfermera. No deseche los coágulos sanguíneos antes de mostrárselos a la enfermera, para asegurarse de que no es tejido placentario. °Si le han colocado una vía intravenosa, se la retirarán dentro de las 24 horas, si no hay problemas. La primera vez que se levante de la cama o tome una ducha, llame a la enfermera para que la ayude que puede sentirse débil, mareada o desmayarse. Si está amamantando, puede sentir contracciones dolorosas en el útero durante algunas semanas. Esto es normal y necesario, ya que de este modo el útero vuelve a su tamaño normal. Si no está amamantando, utilice un sostén de soporte y trate de no tocarse las mamas hasta que haya dejado de producir leche. No deben administrarse hormonas para suprimir la leche, debido a que pueden causar coágulos sanguíneos. Podrá seguir una dieta normal, excepto que sufra diabetes o presente otros problemas de salud.  °La enfermera colocará bolsas con hielo en el sitio de la episiotomía (agrandamiento quirúrgico de la apertura vaginal) para reducir el dolor y la hinchazón. En algunos casos raros hay dificultad para orinar, entonces la enfermera deberá vaciarle la  vejiga con un catéter. Si le han practicado una ligadura tubaria durante el posparto ("trompas atadas", esterilización femenina), esto no hará que permanezca más tiempo en el hospital. °Podrá tener al bebé en su habitación todo el tiempo que lo desee si el bebé no tiene ningún problema. Lleve y traiga al bebé de la nursery dentro de la cunita. No lo lleve en brazos. No abandone el área de posparto. Si la madre es Rh negativa (falta de una proteína en los glóbulos rojos) y el bebé es Rh positivo, la madre debe aplicarse la vacuna RhoGam para evitar problemas con el factor Rh en futuros embarazos °Le darán instrucciones por escrito para usted y el bebé y los medicamentos necesarios cuando reciba el alta médica. Asegúrese que comprende y sigue las indicaciones. °INSTRUCCIONES PARA EL CUIDADO DOMICILIARIO °· Siga las instrucciones y tome los medicamentos que le indicaron cuando le dieron el alta médica.  °· Utilice los medicamentos de venta libre o de prescripción para el dolor, el malestar o la fiebre, según se lo indique el profesional que lo asiste.  °· No tome aspirina, ya que puede causar hemorragias.  °· Aumente sus actividades un poco cada día para tener más fuerza y resistencia.  °· No beba alcohol, especialmente si está amamantando o toma analgésicos.  °· Tómese la temperatura dos veces por día y regístrela.  °· Podrá tener una pequeña hemorragia durante 2 a 4 semanas. Esto es normal.  °· No utilice tampones o duchas vaginales, use toallas higiénicas.  °· Trate de que alguna persona permanezca con usted y la ayude durante los primeros días en el hogar.  °·   Descanse o duerma una siesta cuando el bebé duerma.  °· Si está amamantando, use un buen sostén. Si no está amamantando, use un buen sostén y no estimule los pezones.  °· Consuma una dieta sana y siga tomando las vitaminas prenatales.  °· No conduzca vehículos, no realice actividades pesadas ni viaje hasta que su médico la autorice.  °· No mantenga relaciones  sexuales hasta que el médico lo permita.  °· Consulte con el profesional cuando puede comenzar a realizar actividad física y que tipo de ejercicios puede hacer.  °· Comuníquese inmediatamente con el médico si tiene problemas luego del parto.  °· Comuníquese con el pediatra si tiene problemas con el bebé.  °· Programe su visita de control luego del parto y cúmplala.  °SOLICITE ATENCIÓN MÉDICA SI: °· La temperatura se eleva por encima de 100° F (37.8° C).  °· Aumenta la hemorragia vaginal o elimina coágulos. Conserve algunos coágulos para mostrárselos al médico.  °· Observa sangre o siente dolor al orinar.  °· Presenta secreción vaginal con olor fétido.  °· Aumenta el dolor o la inflamación en el sitio de la episiotomía (agrandamiento quirúrgico de la apertura vaginal).  °· Sufre una cefalea grave.  °· Se siente deprimida.  °· La incisión se abre.  °· Se siente mareada o sufre un desmayo.  °· Aparece una erupción cutánea.  °· Tiene una reacción o problemas con su medicamento.  °· Siente dolor u observa enrojecimiento e hinchazón en el sitio de la vía intravenosa.  °SOLICITE ATENCIÓN MÉDICA DE INMEDIATO SI: °· Siente dolor en el pecho.  °· Comienza a sentir falta de aire.  °· Se desmaya.  °· Siente dolor, con o sin hinchazón e irritación en la pierna.  °· Tiene una hemorragia vaginal abundante, con o sin coágulos  °· Siente dolor en el estómago.  °· Observa una secreción vaginal con mal olor.  °ASEGURESE QUE:  °· Comprende estas instrucciones.  °· Controlará su enfermedad.  °· Solicitará ayuda de inmediato si no mejora o empeora.  °Document Released: 09/05/2007 Document Revised: 10/28/2011 °[employer]® Patient Information ©2012 [employer]. °

## 2012-09-27 DIAGNOSIS — Z3480 Encounter for supervision of other normal pregnancy, unspecified trimester: Secondary | ICD-10-CM

## 2013-02-07 IMAGING — US US OB COMP +14 WK
1 series · 12 of 28 positions shown · non-contrast
Comparison: none

[Series 1: us ob comp +14 wk · 12 of 61 slices shown]
[im 3/61]
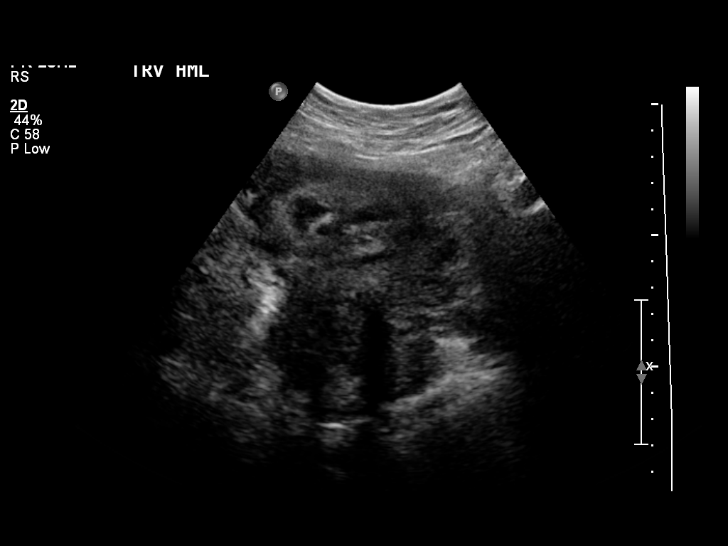
[im 7/61]
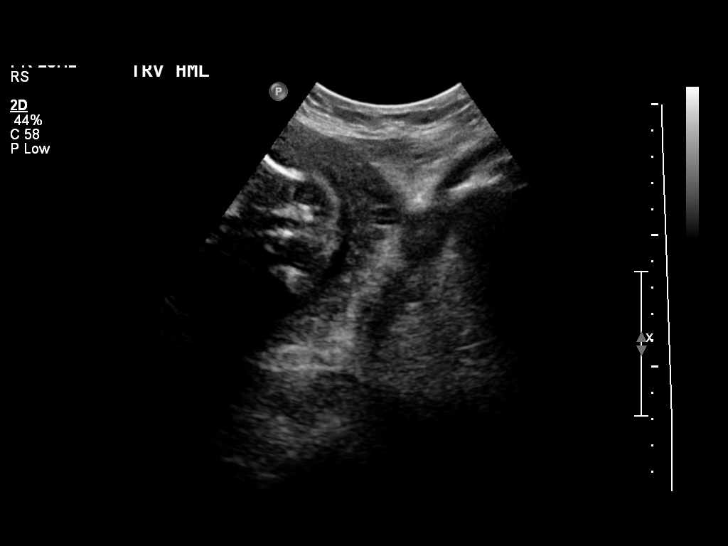
[im 12/61]
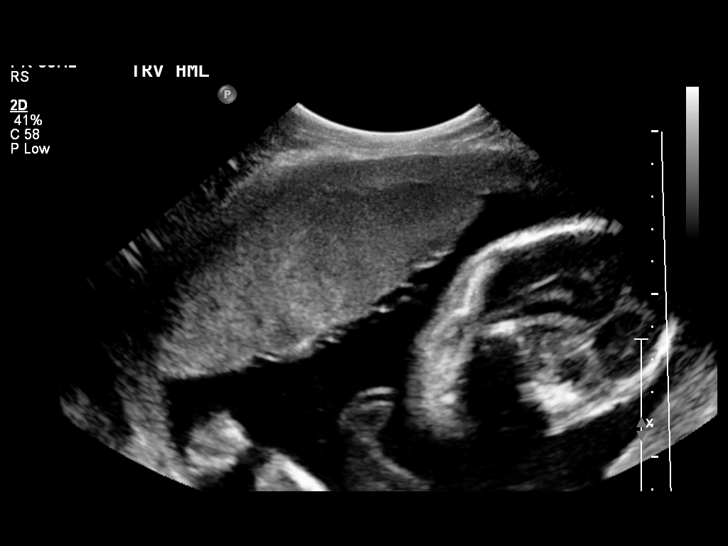
[im 18/61]
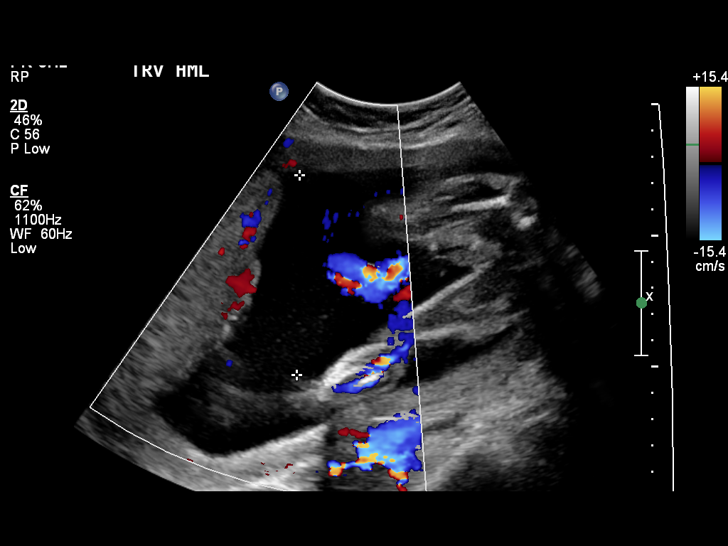
[im 23/61]
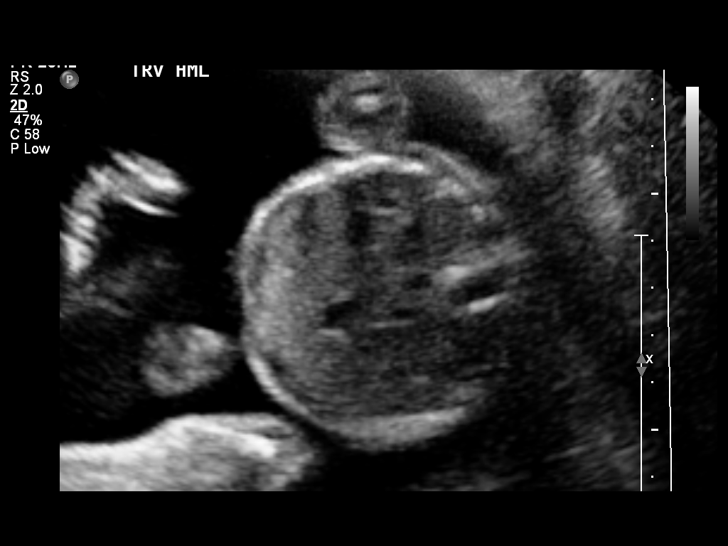
[im 27/61]
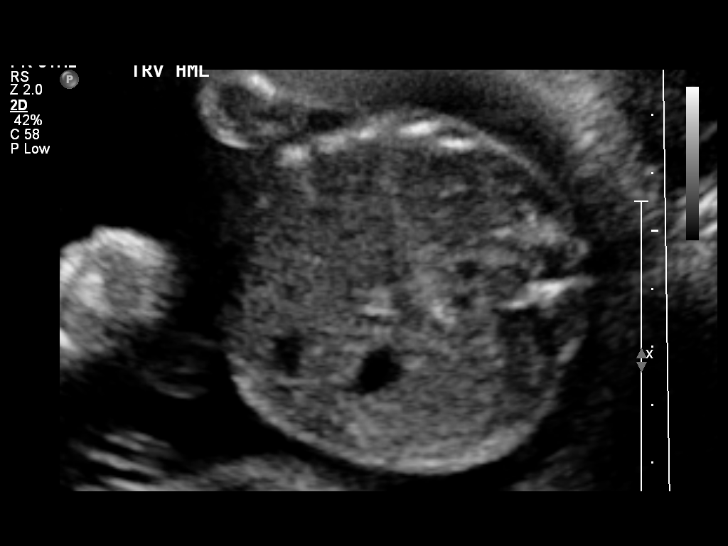
[im 34/61]
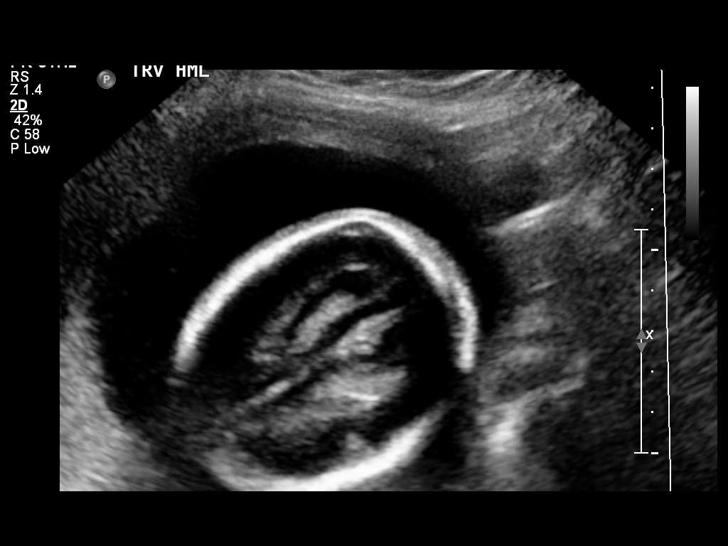
[im 38/61]
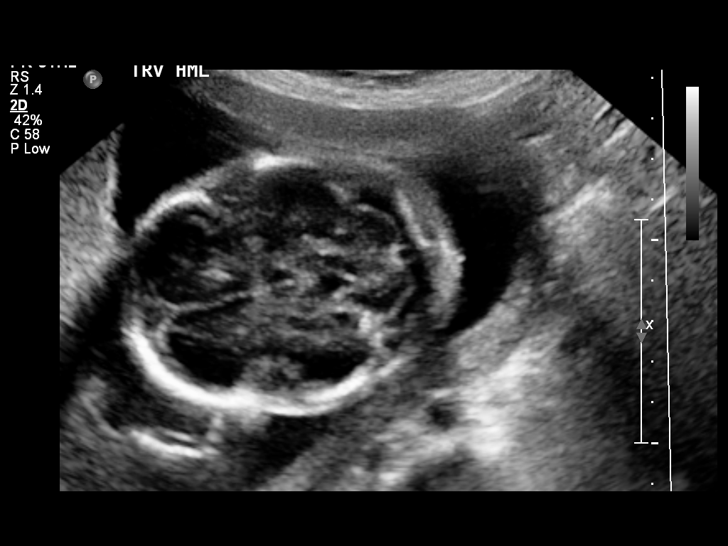
[im 43/61]
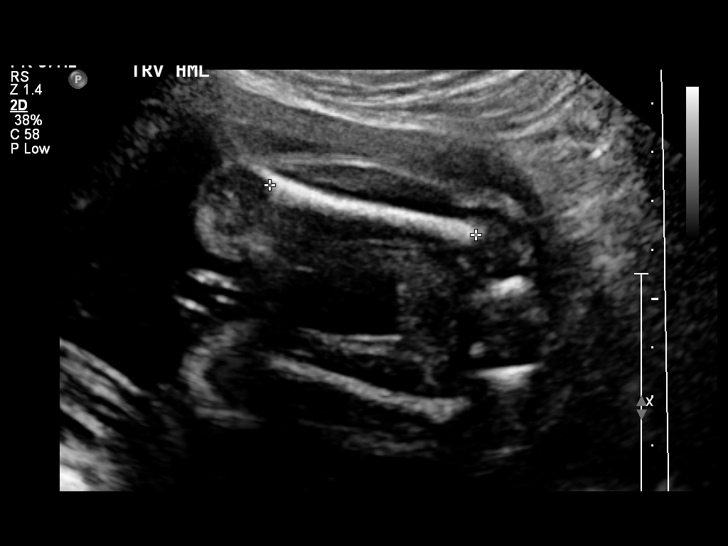
[im 49/61]
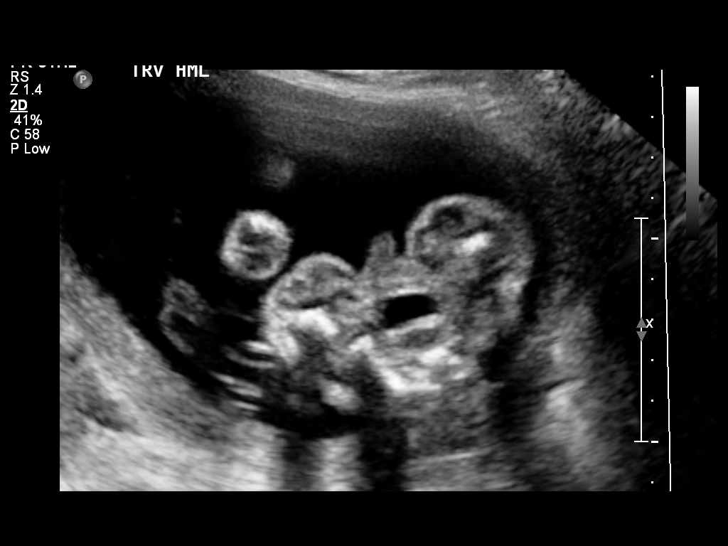
[im 54/61]
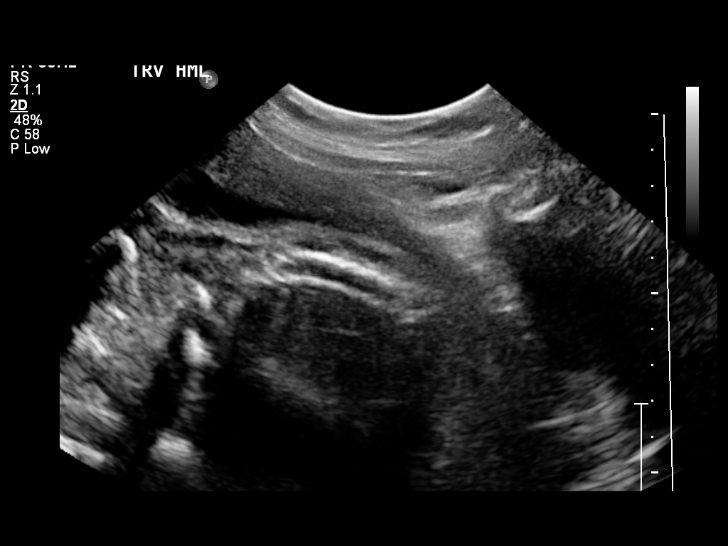
[im 58/61]
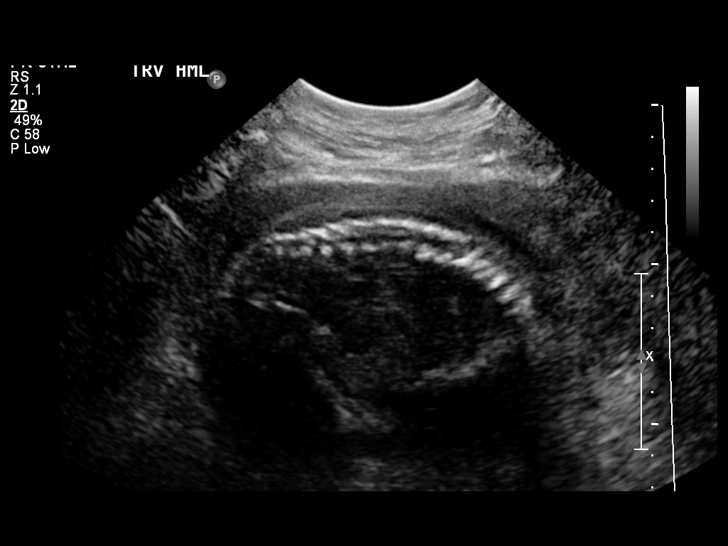

[12 of 28 positions shown; findings below may reference images not displayed]

OBSTETRICS REPORT
                      (Signed Final 09/17/2011 [DATE])

                 CNM
Procedures

 US OB COMP + 14 WK                                    76805.1
Indications

 Pain - Abdominal/Pelvic
 Basic anatomic survey
 No or Little Prenatal Care
Fetal Evaluation

 Fetal Heart Rate:  155                          bpm
 Cardiac Activity:  Observed
 Presentation:      Transverse, head to
                    maternal left
 Placenta:          Anterior Fundal, above
                    cervical os
 P. Cord Insertion: Visualized

 Amniotic Fluid
 AFI FV:      Subjectively within normal limits
                                              Larg Pckt:   7.58  cm
Biometry

 BPD:     60.3  mm     G. Age:  24w 4d                CI:          67.8  70 - 86
                                                      FL/HC:       18.2  18.7 -

 HC:     234.3  mm     G. Age:  25w 3d       79  %    HC/AC:       1.19  1.05 -

 AC:     196.4  mm     G. Age:  24w 2d       47  %    FL/BPD:      70.8  71 - 87
 FL:      42.7  mm     G. Age:  24w 0d       32  %    FL/AC:       21.7  20 - 24

 Est. FW:     682  gm      1 lb 8 oz     55  %
Gestational Age

 LMP:           24w 1d        Date:  04/01/11                 EDD:   01/06/12
 U/S Today:     24w 4d                                        EDD:   01/03/12
 Best:          24w 1d     Det. By:  LMP  (04/01/11)          EDD:   01/06/12
Anatomy

 Cranium:           Appears normal      Aortic Arch:       Basic anatomy
                                                           exam per order
 Fetal Cavum:       Appears normal      Ductal Arch:       Basic anatomy
                                                           exam per order
 Ventricles:        Appears normal      Diaphragm:         Appears normal
 Choroid Plexus:    Appears normal      Stomach:           Appears normal
 Cerebellum:        Appears normal      Abdomen:           Appears normal
 Posterior Fossa:   Appears normal      Abdominal Wall:    Appears nml
                                                           (cord insert, abd
                                                           wall)
 Nuchal Fold:       Not applicable      Cord Vessels:      Appears normal
                    (>20 wks GA)                           (3 vessel cord)
 Face:              Lips appear         Kidneys:           Appear normal
                    normal (basic
                    anatomy exam)
 Heart:             Appears normal      Bladder:           Appears normal
                    (4 chamber &
                    axis)
 RVOT:              Appears normal      Spine:             Appears normal
 LVOT:              Appears normal      Limbs:             Four extremities
                                                           seen

 Other:     Fetus appears to be a female. Technically difficult due to
            fetal position.
Cervix Uterus Adnexa

 Cervical Length:    3.44      cm

 Cervix:       Normal appearance by transabdominal scan.
 Uterus:       No abnormality visualized.
 Cul De Sac:   No free fluid seen.

 Left Ovary:    Not visualized.
 Right Ovary:   Not visualized.

 Adnexa:     No abnormality visualized.
Impression

 Single intrauterine gestation demonstrating an estimated
 gestational age by ultrasound of 24w 4d. This is correlated
 with expected estimated gestational age by LMP of 24w 1d.
 EFW is currently at the 55%.

 Visualized fetal anatomy appears normal. No focal placental
 abnormality is seen

 Subjectively and quantitatively normal amniotic fluid volume.

 Normal cervical length and appearance.

## 2013-09-25 ENCOUNTER — Ambulatory Visit: Payer: Self-pay

## 2013-10-04 ENCOUNTER — Ambulatory Visit: Payer: Self-pay

## 2013-11-01 ENCOUNTER — Ambulatory Visit: Payer: No Typology Code available for payment source

## 2013-11-28 ENCOUNTER — Encounter: Payer: No Typology Code available for payment source | Admitting: Family Medicine

## 2014-09-23 ENCOUNTER — Encounter: Payer: Self-pay | Admitting: Family Medicine

## 2015-01-06 ENCOUNTER — Ambulatory Visit: Payer: Self-pay

## 2015-11-03 ENCOUNTER — Emergency Department (HOSPITAL_COMMUNITY)
Admission: EM | Admit: 2015-11-03 | Discharge: 2015-11-03 | Disposition: A | Discharge status: Home / Self Care | Payer: Self-pay | Attending: Emergency Medicine | Admitting: Emergency Medicine

## 2015-11-03 ENCOUNTER — Encounter (HOSPITAL_COMMUNITY): Payer: Self-pay

## 2015-11-03 DIAGNOSIS — X58XXXA Exposure to other specified factors, initial encounter: Secondary | ICD-10-CM | POA: Insufficient documentation

## 2015-11-03 DIAGNOSIS — Y9389 Activity, other specified: Secondary | ICD-10-CM | POA: Insufficient documentation

## 2015-11-03 DIAGNOSIS — Y998 Other external cause status: Secondary | ICD-10-CM | POA: Insufficient documentation

## 2015-11-03 DIAGNOSIS — Y9289 Other specified places as the place of occurrence of the external cause: Secondary | ICD-10-CM | POA: Insufficient documentation

## 2015-11-03 DIAGNOSIS — Z8619 Personal history of other infectious and parasitic diseases: Secondary | ICD-10-CM | POA: Insufficient documentation

## 2015-11-03 DIAGNOSIS — Z79899 Other long term (current) drug therapy: Secondary | ICD-10-CM | POA: Insufficient documentation

## 2015-11-03 DIAGNOSIS — T161XXA Foreign body in right ear, initial encounter: Secondary | ICD-10-CM | POA: Insufficient documentation

## 2015-11-03 MED ORDER — NEOMYCIN-POLYMYXIN-HC 3.5-10000-1 OT SUSP: 4.0000 [drp] | Freq: Four times a day (QID) | OTIC | Status: DC

## 2015-11-03 NOTE — ED Provider Notes (Signed)
CSN: [id number]     Arrival date & time 11/03/15  0116 History   By signing my name below, I, Amy Organshley Salazar, attest that this documentation has been prepared under the direction and in the presence of Dione Boozeavid Lynnae Ludemann, MD.  Electronically Signed: Arlan OrganAshley Salazar, ED Scribe. 11/03/2015. 2:04 AM.   Chief Complaint  Patient presents with  . Foreign Body in Ear   The history is provided by the patient and a relative. A language interpreter was used.    HPI Comments: Amy Salazar is a 39 y.o. female without any pertinent past medical history who presents to the Emergency Department here for a possible foreign body noted in the R ear. Pt states she feels as though a bug may have crawled into her ear as she feels something moving in her ear canal. No aggravating or alleviating factors. No attempts to remove foreign body prior to arrival. She denies any pain at this time. No recent fever or chills.  PCP: Rodrigo Ranrystal Dorsey, MD    Past Medical History  Diagnosis Date  . Herpes    Past Surgical History  Procedure Laterality Date  . Appendectomy      pt was 39 yo   No family history on file. Social History  Substance Use Topics  . Smoking status: Never Smoker   . Smokeless tobacco: Never Used  . Alcohol Use: No   OB History    Gravida Para Term Preterm AB TAB SAB Ectopic Multiple Living   4 4 4  0 0 0 0 0 0 4     Review of Systems  Constitutional: Negative for fever and chills.  HENT: Negative for ear discharge, ear pain and hearing loss.   Gastrointestinal: Negative for nausea and vomiting.  Psychiatric/Behavioral: Negative for confusion.  All other systems reviewed and are negative.     Allergies  Review of patient's allergies indicates no known allergies.  Home Medications   Prior to Admission medications   Medication Sig Start Date End Date Taking? Authorizing Provider  acyclovir (ZOVIRAX) 400 MG tablet Take 400 mg by mouth 3 (three) times daily. Take everyday until you  deliver baby. 12/15/11   Ivy de Lawson RadarLa Cruz, DO  Prenatal Vit-Fe Fumarate-FA (PRENATAL MULTIVITAMIN) TABS Take 1 tablet by mouth daily.    Historical Provider, MD   Triage Vitals: BP 114/71 mmHg  Pulse 70  Temp(Src) 97.9 F (36.6 C) (Oral)  Resp 16  SpO2 99%  LMP 11/01/2015   Physical Exam  Constitutional: She is oriented to person, place, and time. She appears well-developed and well-nourished. No distress.  HENT:  Head: Normocephalic and atraumatic.  Insect present in R external auditory canal  Eyes: EOM are normal. Pupils are equal, round, and reactive to light.  Neck: Normal range of motion. Neck supple.  Cardiovascular: Normal rate, regular rhythm and normal heart sounds.   Pulmonary/Chest: Effort normal and breath sounds normal. She has no wheezes. She has no rales.  Abdominal: Soft. She exhibits no distension. There is no tenderness.  Musculoskeletal: Normal range of motion. She exhibits no edema.  Neurological: She is alert and oriented to person, place, and time. No cranial nerve deficit. She exhibits normal muscle tone. Coordination normal.  Skin: Skin is warm and dry. No rash noted.  Psychiatric: She has a normal mood and affect. Her behavior is normal. Judgment and thought content normal.  Nursing note and vitals reviewed.   ED Course  Procedures (including critical care time)  DIAGNOSTIC STUDIES: Oxygen Saturation is  99% on RA, Normal by my interpretation.    COORDINATION OF CARE: 1:56 AM-Discussed treatment plan with pt at bedside and pt agreed to plan.     I originally attempted to remove the insect, but it all deeper in the external canal. The ear was then filled with viscous Xylocaine and the patient noted that she no longer was feeling the insect moving. I attempted to irrigate the Arava was not able to remove the insect. I attempted to remove it with a pointed forceps but only a portion of the insect was able to be retrieved. She will be discharged with prescription  for Cortisporin Otic and is referred to ENT for did removal of the foreign body.  MDM   Final diagnoses:  Foreign body in right ear, initial encounter    Foreign body in the ear as described above.  I personally performed the services described in this documentation, which was scribed in my presence. The recorded information has been reviewed and is accurate.      Dione Booze, MD 11/03/15 [phone]

## 2015-11-03 NOTE — ED Notes (Signed)
Pt doesn't speak english, here with family that does. States that she started feeling something moving in her right ear.

## 2015-11-03 NOTE — Discharge Instructions (Signed)
Cuerpo extraño en el oído  (Ear Foreign Body)  Un cuerpo extraño en el oído es un objeto que se atasca en este órgano. Generalmente, se atasca en el conducto auditivo externo.  CAUSAS  En personas de todas las edades, los cuerpos extraños más comunes son insectos que ingresan en el conducto auditivo externo. Los niños pequeños se introducen frecuentemente objetos dentro de este conducto, los cuales pueden incluir guijarros, cuentas, partes de juguetes y otros objetos pequeños que caben dentro del oído. En los adultos, objetos tales como los hisopos pueden incrustarse en el conducto auditivo externo.   SIGNOS Y SÍNTOMAS  Un cuerpo extraño en el oído puede causar lo siguiente:  · Dolor.  · Zumbidos o crepitaciones.  · Pérdida auditiva.  · Secreción o sangrado.  · Náuseas y vómitos.  · Sensación de que el oído está tapado.  DIAGNÓSTICO  El médico puede diagnosticar un cuerpo extraño en el oído en función de la información provista, los síntomas y un examen físico. Además, puede realizar estudios, por ejemplo, pruebas de audición y determinación de la presión en el oído, a fin de controlar si hay infecciones u otros problemas causados por el cuerpo extraño.  TRATAMIENTO  El tratamiento depende del tipo de cuerpo extraño, su ubicación dentro del oído y de si este ha causado lesiones en cualquier parte del oído interno. Si el médico puede ver el cuerpo extraño, tal vez sea posible extraerlo de la siguiente forma:  · Con una herramienta, por ejemplo, pinzas o un tubo de succión (catéter).  · Con irrigación, la cual utiliza agua para sacar el cuerpo extraño del oído. Esta opción se usa únicamente si no hay probabilidades de que el cuerpo extraño se hinche o se agrande cuando se lo sumerge en agua.  Si el cuerpo extraño no puede verse o si el médico no pudo sacarlo, tal vez lo deriven a un especialista para que lo extraiga. Además, pueden indicarle que tome antibióticos o se ponga gotas óticas para evitar las infecciones. Si  el cuerpo extraño ha causado lesiones en otras partes del oído, es posible que necesite tratamiento adicional.  INSTRUCCIONES PARA EL CUIDADO EN EL HOGAR  · Concurra a todas las visitas de control como se lo haya indicado el médico. Esto es importante.  · Tome los medicamentos solamente como se lo haya indicado el médico.  · Si le recetaron antibióticos, asegúrese de terminarlos, incluso si comienza a sentirse mejor.  PREVENCIÓN  · Mantenga todos los objetos pequeños fuera del alcance de los niños de corta edad. Dígales a los niños que no se introduzcan objetos en los oídos.  · No se introduzca nada en el oído, incluidos hisopos, para limpiarlos. Hable con el médico sobre cómo limpiarse los oídos de manera segura.  SOLICITE ATENCIÓN MÉDICA SI:  · Tiene dolores de cabeza.  · Le sale sangre del oído.  · Tiene fiebre.  · Aumentan el dolor o la hinchazón en el oído.  · Disminuye la audición.  · Le supura el oído.     Esta información no tiene como fin reemplazar el consejo del médico. Asegúrese de hacerle al médico cualquier pregunta que tenga.     Document Released: 11/08/2005 Document Revised: 11/29/2014  Elsevier Interactive Patient Education ©2016 Elsevier Inc.

## 2015-11-03 NOTE — ED Notes (Signed)
MD at bedside. 

## 2015-11-03 NOTE — ED Notes (Signed)
Pt verbalized understanding of d/c instructions, prescriptions, and follow-up care. No further questions/concerns, VSS, ambulatory w/ steady gait (refused wheelchair) 

## 2015-12-26 ENCOUNTER — Ambulatory Visit: Payer: Self-pay

## 2016-01-14 ENCOUNTER — Ambulatory Visit: Payer: Self-pay

## 2016-02-19 ENCOUNTER — Ambulatory Visit (INDEPENDENT_AMBULATORY_CARE_PROVIDER_SITE_OTHER): Payer: Self-pay | Admitting: Family Medicine

## 2016-02-19 ENCOUNTER — Other Ambulatory Visit (HOSPITAL_COMMUNITY)
Admission: RE | Admit: 2016-02-19 | Discharge: 2016-02-19 | Disposition: A | Discharge status: Home / Self Care | Payer: Self-pay | Source: Ambulatory Visit | Attending: Family Medicine | Admitting: Family Medicine

## 2016-02-19 ENCOUNTER — Encounter: Payer: Self-pay | Admitting: Family Medicine

## 2016-02-19 VITALS — BP 125/60 | HR 55 | Temp 97.4°F | Ht 63.0 in | Wt 159.4 lb

## 2016-02-19 DIAGNOSIS — K029 Dental caries, unspecified: Secondary | ICD-10-CM

## 2016-02-19 DIAGNOSIS — Z1151 Encounter for screening for human papillomavirus (HPV): Secondary | ICD-10-CM | POA: Insufficient documentation

## 2016-02-19 DIAGNOSIS — D649 Anemia, unspecified: Secondary | ICD-10-CM

## 2016-02-19 DIAGNOSIS — Z0000 Encounter for general adult medical examination without abnormal findings: Secondary | ICD-10-CM

## 2016-02-19 DIAGNOSIS — R300 Dysuria: Secondary | ICD-10-CM

## 2016-02-19 DIAGNOSIS — Z01419 Encounter for gynecological examination (general) (routine) without abnormal findings: Secondary | ICD-10-CM | POA: Insufficient documentation

## 2016-02-19 DIAGNOSIS — E663 Overweight: Secondary | ICD-10-CM

## 2016-02-19 DIAGNOSIS — Z124 Encounter for screening for malignant neoplasm of cervix: Secondary | ICD-10-CM

## 2016-02-19 LAB — POCT URINALYSIS DIPSTICK
Bilirubin, UA: NEGATIVE
Glucose, UA: NEGATIVE
Leukocytes, UA: NEGATIVE
NITRITE UA: NEGATIVE
PROTEIN UA: NEGATIVE
UROBILINOGEN UA: 0.2
pH, UA: 5.5

## 2016-02-19 LAB — POCT UA - MICROSCOPIC ONLY

## 2016-02-19 LAB — CBC
HCT: 36.4 % (ref 36.0–46.0)
HEMOGLOBIN: 12.2 g/dL (ref 12.0–15.0)
MCH: 29.5 pg (ref 26.0–34.0)
MCHC: 33.5 g/dL (ref 30.0–36.0)
MCV: 88.1 fL (ref 78.0–100.0)
MPV: 9.7 fL (ref 8.6–12.4)
Platelets: 241 10*3/uL (ref 150–400)
RBC: 4.13 MIL/uL (ref 3.87–5.11)
RDW: 13.4 % (ref 11.5–15.5)
WBC: 8.1 10*3/uL (ref 4.0–10.5)

## 2016-02-19 LAB — GLUCOSE, CAPILLARY: GLUCOSE-CAPILLARY: 86 mg/dL (ref 65–99)

## 2016-02-19 NOTE — Patient Instructions (Signed)
Cosas que hacer para mantenerse saludable  - Haga ejercicio al menos 30-45 minutos al da, 3-4 das a la Miguel Barrerasemana.  - Coma una dieta baja en grasa con un montn de frutas y verduras, hasta 7-9 porciones al Futures traderda.  - Los cinturones de seguridad pueden salvar su vida. Llvelos siempre.  - Detectores de humo en cada nivel de su hogar, revise las bateras cada ao.  - Violencia - Si alguien te amenaza o te hace dao, por favor llama inmediatamente.

## 2016-02-19 NOTE — Progress Notes (Signed)
Patient ID: Amy Salazar, female   DOB: February 14, 1976, 40 y.o.   MRN: [id number] 40 y.o. year old female presents for well woman/preventative visit and annual GYN examination. Spanish interpreter Jesusita Oka [phone] utilized for this entire encounter  Of note, the patient has not been seen here in >4 years.   Acute Concerns: For sometime, 2-3 days prior to her period she's had some abnormal vaginal secretion with vulvar pruritus. Discharge is thin and yellow. She also notes occasional pain with urination during her menses that resolves after her menses is over. The pain is most prominent when the urine comes out. She cannot describe her pain. She denies back pain, fevers, or chills. She denies any pain or discharge currently.   Diet: varied, eats fruits, vegetables, and gets adequate calcium.   Exercise: Not regularly   Sexual/Birth History: LMP: 02/06/16; [id number] all via SVD without complications.  Birth Control: No birth control use, declines contraception at this time.   POA/Living Will: none  ROS: all systems reviewed and negative besides that noted above.   Social:  Social History   Social History  . Marital Status: Single    Spouse Name: N/A  . Number of Children: N/A  . Years of Education: N/A   Social History Main Topics  . Smoking status: Never Smoker   . Smokeless tobacco: Never Used  . Alcohol Use: No  . Drug Use: No  . Sexual Activity: Yes   Other Topics Concern  . None   Social History Narrative    Immunization: Immunization History  Administered Date(s) Administered  . Influenza Split 12/28/2011  . 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 GEN: Pleasant female, NAD HEENT: Normocephalic, PERRL, EOMI, no scleral icterus, bilateral TM pearly grey, nasal septum midline, MMM, uvula midline, no anterior or posterior lymphadenopathy, Dentition with caries, one particularly bothersome on my exam in left upper molar.  No thyromegaly CARDIAC:RRR, S1 and S2 present, no murmur, no heaves/thrills RESP: CTAB, normal effort ABD: soft, no tenderness, normal bowel sounds GU/GYN:Exam performed in the presence of a chaperone. External genitalia within normal limits.  Vaginal mucosa pink, moist, normal rugae.  Nonfriable cervix without lesions, no discharge or bleeding noted on speculum exam.  Bimanual exam revealed normal, nongravid uterus.  No cervical motion tenderness. No adnexal masses bilaterally.   EXT: No edema, 2+ radial and DP pulses SKIN: no rash  ASSESSMENT & PLAN: 40 y.o. female presents for annual well woman/preventative exam and GYN exam. Please see problem specific assessment and plan.   Health care maintenance Patient due for a pap smear, done today  Declined annual influenza vaccine  Discussed starting mammograms between age 66-50, patient will hold off for now Discussed contraception and avoidance of undesired pregnancy, patient does not wish to obtain contraception currently. CBG normal, no family h/o DM per her report Discussed at least 150 minutes of cardiovascular exercise in addition to a well balance diet   Dental caries No evidence of abscess noted, however very bothersome for patient.  -  will refer to dentistry.  Vaginal discharge during menses:   Maybe hormonally mediated; discussed some patients tend to get infections around their periods. No abnormalities on exam however patient currently denies any symptoms. Recommended patient f/u in our  clinic when she has the symptoms.

## 2016-02-20 ENCOUNTER — Encounter: Payer: Self-pay | Admitting: Family Medicine

## 2016-02-20 DIAGNOSIS — Z0000 Encounter for general adult medical examination without abnormal findings: Secondary | ICD-10-CM | POA: Insufficient documentation

## 2016-02-20 DIAGNOSIS — K029 Dental caries, unspecified: Secondary | ICD-10-CM | POA: Insufficient documentation

## 2016-02-20 NOTE — Assessment & Plan Note (Signed)
Patient due for a pap smear, done today  Declined annual influenza vaccine  Discussed starting mammograms between age 42-50, patient will hold off for now Discussed contraception and avoidance of undesired pregnancy, patient does not wish to obtain contraception currently. CBG normal, no family h/o DM per her report Discussed at least 150 minutes of cardiovascular exercise in addition to a well balance diet

## 2016-02-20 NOTE — Assessment & Plan Note (Signed)
No evidence of abscess noted, however very bothersome for patient.  - will refer to dentistry.

## 2016-02-24 LAB — CYTOLOGY - PAP

## 2016-02-25 ENCOUNTER — Telehealth: Payer: Self-pay | Admitting: Family Medicine

## 2016-02-25 NOTE — Telephone Encounter (Signed)
Please call and let the patient know that her pap smear was normal. We will need to repeat in 3-5 years.   Thanks, Joanna Puffrystal S. Aldyn Toon, MD [facility] Resident  02/25/2016, 11:32 AM

## 2016-03-04 NOTE — Telephone Encounter (Signed)
Pt informed by using spanish interpretor salvador [phone]. Amy Salazar Bruna PotterBlount, CMA

## 2016-03-24 ENCOUNTER — Telehealth: Payer: Self-pay | Admitting: Family Medicine

## 2016-03-24 NOTE — Telephone Encounter (Signed)
Patient asks PCP to order dentist referral (it was requested on April 30). Please, follow up (Spanis).

## 2016-03-24 NOTE — Telephone Encounter (Signed)
This referral was placed at the time of her appt. We discussed at that time it may be a while to get in as there were no signs of infection requiring an emergent apt. From looking at the referral history the [facility] and is now dependent on them. Please let the pt know this.  Thanks Joanna Puffrystal S. Dorsey, MD [facility] Resident  03/24/2016, 6:05 PM

## 2017-04-04 ENCOUNTER — Encounter (HOSPITAL_COMMUNITY): Payer: Self-pay

## 2017-04-04 ENCOUNTER — Emergency Department (HOSPITAL_COMMUNITY)
Admission: EM | Admit: 2017-04-04 | Discharge: 2017-04-04 | Disposition: A | Discharge status: Home / Self Care | Payer: Self-pay | Attending: Emergency Medicine | Admitting: Emergency Medicine

## 2017-04-04 DIAGNOSIS — Z8619 Personal history of other infectious and parasitic diseases: Secondary | ICD-10-CM | POA: Insufficient documentation

## 2017-04-04 DIAGNOSIS — R102 Pelvic and perineal pain: Secondary | ICD-10-CM | POA: Insufficient documentation

## 2017-04-04 LAB — WET PREP, GENITAL
Sperm: NONE SEEN
TRICH WET PREP: NONE SEEN
Yeast Wet Prep HPF POC: NONE SEEN

## 2017-04-04 MED ORDER — ACYCLOVIR 400 MG PO TABS: 400.0000 mg | ORAL_TABLET | Freq: Four times a day (QID) | ORAL | 0 refills | Status: DC

## 2017-04-04 MED ORDER — LIDOCAINE HCL 2 % EX GEL
1.0000 "application " | Freq: Once | CUTANEOUS | Status: AC
  Administered 2017-04-04: 1 via TOPICAL
  Filled 2017-04-04: qty 20

## 2017-04-04 MED ORDER — HYDROCODONE-ACETAMINOPHEN 5-325 MG PO TABS: 1.0000 | ORAL_TABLET | ORAL | 0 refills | Status: DC | PRN

## 2017-04-04 NOTE — Discharge Instructions (Addendum)
Recommend Vagisil topical for relief of pain. Take Norco as needed for severe pain. Follow up with your gynecologist if symptoms persist.

## 2017-04-04 NOTE — ED Triage Notes (Signed)
Pt complaining of itching and rash to gentials. Pt denies any urinary symptoms. Pt denies any N/V/D. Pt denies any abdominal pain. Pt denies any vaginal bleeding or discharge.

## 2017-04-04 NOTE — ED Provider Notes (Signed)
[facility]-EMERGENCY DEPT Provider Note   CSN: [id number] Arrival date & time: 04/04/17  0129     History   Chief Complaint Chief Complaint  Patient presents with  . Vaginal Itching    HPI Amy Salazar is a 41 y.o. female.  Patient presents with complaint of vaginal pain to external vagina x 3 days. She states that it started out as vaginal itching and she used Monistat cream. She now has burning type pain that is worse when it comes into contact with urine, with touch or movement. No pelvic pain, vaginal discharge, nausea, fever or abdominal pain.   The history is provided by the patient. A language interpreter was used Garment/textile technologist is family member at bedside).  Vaginal Itching  Pertinent negatives include no abdominal pain.    Past Medical History:  Diagnosis Date  . Herpes   . Pyelonephritis complicating pregnancy, antepartum 09/20/2011    Patient Active Problem List   Diagnosis Date Noted  . Health care maintenance 02/20/2016  . Dental caries 02/20/2016  . Vaginal discharge 06/23/2011  . 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 Overweight (BMI 25.0-29.9) 05/10/2011    Past Surgical History:  Procedure Laterality Date  . APPENDECTOMY     pt was 41 yo    OB History    Gravida Para Term Preterm AB Living   4 4 4  0 0 4   SAB TAB Ectopic Multiple Live Births   0 0 0 0 4       Home Medications    Prior to Admission medications   Medication Sig Start Date End Date Taking? Authorizing Provider  acyclovir (ZOVIRAX) 400 MG tablet Take 400 mg by mouth 3 (three) times daily. Take everyday until you deliver baby. 12/15/11   de Lawson Radar, Ivy, DO  neomycin-polymyxin-hydrocortisone (CORTISPORIN) 3.5-10000-1 otic suspension Place 4 drops into both ears 4 (four) times daily. X 7 days 11/03/15   Dione Booze, MD  Prenatal Vit-Fe Fumarate-FA (PRENATAL MULTIVITAMIN) TABS Take 1 tablet by mouth daily.    [provider]    Family History History reviewed. No pertinent  family history.  Social History Social History  Substance Use Topics  . Smoking status: Never Smoker  . Smokeless tobacco: Never Used  . Alcohol use No     Allergies   Patient has no known allergies.   Review of Systems Review of Systems  Constitutional: Negative for chills and fever.  Gastrointestinal: Negative.  Negative for abdominal pain and nausea.  Genitourinary: Positive for dysuria (See HPI.) and vaginal pain. Negative for frequency and vaginal discharge.  Neurological: Negative.      Physical Exam Updated Vital Signs BP 118/74   Pulse 65   Temp 98.3 F (36.8 C)   Resp 16   LMP 03/11/2017 (Exact Date)   SpO2 98%   Physical Exam  Constitutional: She is oriented to person, place, and time. She appears well-developed and well-nourished.  Neck: Normal range of motion.  Pulmonary/Chest: Effort normal.  Genitourinary:  Genitourinary Comments: There is erythema and ulceration at the vaginal introitus. There is a whitish plaque that extends to perineum. No discrete blisters or clusters.  Neurological: She is alert and oriented to person, place, and time.  Skin: Skin is warm and dry.     ED Treatments / Results  Labs (all labs ordered are listed, but only abnormal results are displayed) Labs Reviewed  WET PREP, GENITAL - Abnormal; Notable for the following:       Result Value  Clue Cells Wet Prep HPF POC PRESENT (*)    WBC, Wet Prep HPF POC MANY (*)    All other components within normal limits    EKG  EKG Interpretation None       Radiology No results found.  Procedures Procedures (including critical care time)  Medications Ordered in ED Medications  lidocaine (XYLOCAINE) 2 % jelly 1 application (1 application Topical Given 04/04/17 0437)     Initial Impression / Assessment and Plan / ED Course  I have reviewed the triage vital signs and the nursing notes.  Pertinent labs & imaging results that were available during my care of the patient  were reviewed by me and considered in my medical decision making (see chart for details).     Patient with vaginal pain. She has a history of HSV-2. There is a thick, white discharge that appears consistent with yeast, however, wet prep is negative. Symptoms likely an occurrence of HSV-2. Will start on Acyclovir and encourage follow up with GYN.  Final Clinical Impressions(s) / ED Diagnoses   Final diagnoses:  None   1. Vaginal pain 2. History of HSV-2  New Prescriptions New Prescriptions   No medications on file     Elpidio AnisUpstill, Jolean Madariaga, Cordelia Poche-C 04/04/17 0507    Gilda CreasePollina, Christopher J, MD 04/04/17 [phone]

## 2017-04-14 ENCOUNTER — Other Ambulatory Visit (HOSPITAL_COMMUNITY)
Admission: RE | Admit: 2017-04-14 | Discharge: 2017-04-14 | Disposition: A | Discharge status: Home / Self Care | Payer: Self-pay | Source: Ambulatory Visit | Attending: Family Medicine | Admitting: Family Medicine

## 2017-04-14 ENCOUNTER — Ambulatory Visit (INDEPENDENT_AMBULATORY_CARE_PROVIDER_SITE_OTHER): Payer: Self-pay | Admitting: Family Medicine

## 2017-04-14 ENCOUNTER — Encounter: Payer: Self-pay | Admitting: Family Medicine

## 2017-04-14 VITALS — BP 90/68 | HR 66 | Temp 98.1°F | Wt 152.0 lb

## 2017-04-14 DIAGNOSIS — N760 Acute vaginitis: Secondary | ICD-10-CM

## 2017-04-14 DIAGNOSIS — R300 Dysuria: Secondary | ICD-10-CM | POA: Insufficient documentation

## 2017-04-14 DIAGNOSIS — A609 Anogenital herpesviral infection, unspecified: Secondary | ICD-10-CM

## 2017-04-14 DIAGNOSIS — B9689 Other specified bacterial agents as the cause of diseases classified elsewhere: Secondary | ICD-10-CM

## 2017-04-14 LAB — POCT URINALYSIS DIP (MANUAL ENTRY)
BILIRUBIN UA: NEGATIVE mg/dL
Glucose, UA: NEGATIVE mg/dL
Leukocytes, UA: NEGATIVE
Nitrite, UA: NEGATIVE
PH UA: 5.5 (ref 5.0–8.0)
PROTEIN UA: NEGATIVE mg/dL
Urobilinogen, UA: 0.2 E.U./dL

## 2017-04-14 LAB — POCT UA - MICROSCOPIC ONLY

## 2017-04-14 MED ORDER — METRONIDAZOLE 500 MG PO TABS: 500.0000 mg | ORAL_TABLET | Freq: Two times a day (BID) | ORAL | 0 refills | Status: DC

## 2017-04-14 MED ORDER — HYDROCODONE-ACETAMINOPHEN 5-325 MG PO TABS: 1.0000 | ORAL_TABLET | ORAL | 0 refills | Status: DC | PRN

## 2017-04-14 MED ORDER — LIDOCAINE 5 % EX OINT: 1.0000 "application " | TOPICAL_OINTMENT | CUTANEOUS | 0 refills | Status: DC | PRN

## 2017-04-14 NOTE — Patient Instructions (Signed)
Herpes genital (Genital Herpes) El herpes genital es una enfermedad de transmisin sexual (ETS) comn causada por un virus. El virus se propaga de una persona a otra a travs del contacto sexual. La infeccin puede causar picazn, ampollas y llagas en la zona genital o rectal. Esto se denomina erupcin. Se presenta en hombres y mujeres. El herpes genital es particularmente preocupante para las mujeres embarazadas porque el virus puede transmitirse al beb durante el parto y causar problemas graves. El herpes genital tambin es una preocupacin para las personas con un sistema de defensa (inmunitario) debilitado. Los sntomas del herpes genital pueden durar varios das y luego desaparecer. Sin embargo, el virus permanece en el cuerpo, de modo que es posible que tenga ms erupciones en el futuro. El tiempo entre las erupciones vara: pueden transcurrir meses o aos. CAUSAS El herpes genital es causado por el virus del herpes simple (VHS) tipo2 o el VHS tipo1. Estos virus son contagiosos y, por lo general, se transmiten a travs del contacto sexual con una persona infectada. El contacto sexual incluye sexo vaginal, anal y oral. FACTORES DE RIESGO Los factores de riesgo de herpes genital incluyen:  Ser sexualmente activo con mltiples parejas.  Mantener relaciones sexuales sin proteccin. SIGNOS Y SNTOMAS Entre los sntomas se pueden incluir los siguientes:  Dolor y picazn en la zona genital o rectal.  Pequeos bultos rojos que se convierten en ampollas y luego en llagas.  Sntomas similares a los de la gripe, como:  Fiebre.  Dolores en el cuerpo.  Dolor al orinar.  Flujo vaginal. DIAGNSTICO El herpes genital se puede diagnosticar mediante:  Examen fsico.  Anlisis de sangre.  Prueba de cultivo del lquido de una llaga abierta. TRATAMIENTO No hay cura para el herpes genital. Se pueden usar medicamentos antivirales orales para acelerar la curacin y ayudar a evitar que los  sntomas vuelvan a aparecer. Estos medicamentos tambin ayudan a reducir el contagio del virus a las parejas sexuales. INSTRUCCIONES PARA EL CUIDADO EN EL HOGAR  Mantenga las zonas afectadas secas y limpias.  Tome los medicamentos solamente como se lo haya indicado el mdico.  No tenga contacto sexual durante infecciones activas. El herpes genital es contagioso.  Practique el sexo seguro. Los preservativos de ltex y los preservativos femeninos pueden ayudar a evitar el contagio del virus del herpes.  Evite frotar o tocar las ampollas y las llagas. Si lo hace:  Lvese bien las manos.  No se toque los ojos despus de ello.  Si queda embarazada, informe a su mdico si ha tenido herpes genital.  Concurra a todas las visitas de control como se lo haya indicado el mdico. Esto es importante. PREVENCIN  Use preservativos. Aunque una persona puede contraer herpes genital durante el contacto sexual incluso con el uso de un preservativo, el preservativo puede brindar cierta proteccin.  Evite tener mltiples parejas sexuales.  Hable con su pareja sexual acerca de los sntomas y los antecedentes que cualquiera de los dos pudiera tener.  Hgase pruebas antes de tener sexo. Pdale a su pareja que haga lo mismo.  Reconozca los sntomas de herpes genital. No tenga contacto sexual si nota estos sntomas. SOLICITE ATENCIN MDICA SI:  Los sntomas no mejoran con los medicamentos.  Los sntomas vuelven a aparecer.  Aparecen nuevos sntomas.  Tiene fiebre.  Siente dolor abdominal.  Presenta enrojecimiento, hinchazn o dolor en el ojo. ASEGRESE DE QUE:  Comprende estas instrucciones.  Controlar su afeccin.  Recibir ayuda de inmediato si no mejora o si   empeora. Esta informacin no tiene Theme park managercomo fin reemplazar el consejo del mdico. Asegrese de hacerle al mdico cualquier pregunta que tenga. Document Released: 08/18/2005 Document Revised: 11/29/2014 Document Reviewed:  03/26/2014 Elsevier Interactive Patient Education  2017 [employer]

## 2017-04-18 NOTE — Progress Notes (Signed)
Subjective:     Patient ID: Amy Salazar, female   DOB: 05/21/1976, 41 y.o.   MRN: [id number]  HPI Amy Salazar is a 41yo female presenting today for vaginal pain. Visit conducted with aid of Spanish interpretor. Presented to ED on 5/14 with same complaint and diagnosed with herpes flare. Given course of Valtrex. Wet Prep also with BV, but this was not treated.  Continues to note severe vaginal pain and she is unable to sit due to the lesions. Lesions appeared as vesicles initially, but then became sores. Notes mild vaginal discharge. Notes dysuria and decreased frequency due to pain. Denies fever. Denies abdominal or pelvic pain. Denies history of prior. Diagnosed with HSV type 2 in 06/2011. Has not been sexually active since lesions appeared due to the pain. Nonsmoker.  Review of Systems Per HPI    Objective:   Physical Exam  Constitutional: She appears well-developed and well-nourished. No distress.  Cardiovascular: Normal rate and regular rhythm.   No murmur heard. Genitourinary: No vaginal discharge found.  Genitourinary Comments: No cervical motion tenderness, no adnexal tenderness.  Skin:  Multiple genital ulcers noted from entrance of vagina down perineum, tender.   Psychiatric: She has a normal mood and affect. Her behavior is normal.      Assessment and Plan:     1. HSV (herpes simplex virus) anogenital infection Suspected given presentation and prior history of HSV. UA with moderate blood, which appears to be consistently present for years. Complete course of Valtrex. Lidocaine ointment given with instructions to not use it in vaginal or rectally. GC/Chlamydia screen pending. Recommended no intercourse until lesions resolve. Follow up if no improvement. Repeat Urinalysis at next office visit with improvement of symptoms to monitor hematuria--referral to Urology if no improvement.  2. Bacterial Vaginosis  Noted on Wet Prep from ED. Flagyl prescribed.

## 2017-04-19 LAB — CERVICOVAGINAL ANCILLARY ONLY
CHLAMYDIA, DNA PROBE: NEGATIVE
Neisseria Gonorrhea: NEGATIVE

## 2019-01-09 ENCOUNTER — Ambulatory Visit: Payer: Self-pay | Admitting: Family Medicine

## 2019-01-15 ENCOUNTER — Encounter (HOSPITAL_COMMUNITY): Payer: Self-pay

## 2019-01-15 ENCOUNTER — Other Ambulatory Visit: Payer: Self-pay

## 2019-01-15 ENCOUNTER — Emergency Department (HOSPITAL_COMMUNITY)
Admission: EM | Admit: 2019-01-15 | Discharge: 2019-01-15 | Disposition: A | Discharge status: Home / Self Care | Payer: Self-pay | Attending: Emergency Medicine | Admitting: Emergency Medicine

## 2019-01-15 DIAGNOSIS — R1013 Epigastric pain: Secondary | ICD-10-CM

## 2019-01-15 DIAGNOSIS — Z79899 Other long term (current) drug therapy: Secondary | ICD-10-CM | POA: Insufficient documentation

## 2019-01-15 LAB — COMPREHENSIVE METABOLIC PANEL
ALK PHOS: 55 U/L (ref 38–126)
ALT: 10 U/L (ref 0–44)
AST: 15 U/L (ref 15–41)
Albumin: 4.3 g/dL (ref 3.5–5.0)
Anion gap: 8 (ref 5–15)
BUN: 11 mg/dL (ref 6–20)
CALCIUM: 9.5 mg/dL (ref 8.9–10.3)
CHLORIDE: 105 mmol/L (ref 98–111)
CO2: 25 mmol/L (ref 22–32)
CREATININE: 0.77 mg/dL (ref 0.44–1.00)
Glucose, Bld: 104 mg/dL — ABNORMAL HIGH (ref 70–99)
Potassium: 4.2 mmol/L (ref 3.5–5.1)
Sodium: 138 mmol/L (ref 135–145)
Total Bilirubin: 0.2 mg/dL — ABNORMAL LOW (ref 0.3–1.2)
Total Protein: 7.5 g/dL (ref 6.5–8.1)

## 2019-01-15 LAB — URINALYSIS, ROUTINE W REFLEX MICROSCOPIC
Bilirubin Urine: NEGATIVE
Glucose, UA: NEGATIVE mg/dL
Ketones, ur: NEGATIVE mg/dL
Leukocytes,Ua: NEGATIVE
Nitrite: NEGATIVE
PH: 5 (ref 5.0–8.0)
Protein, ur: NEGATIVE mg/dL
SPECIFIC GRAVITY, URINE: 1.009 (ref 1.005–1.030)

## 2019-01-15 LAB — I-STAT BETA HCG BLOOD, ED (MC, WL, AP ONLY)

## 2019-01-15 LAB — CBC
HEMATOCRIT: 40 % (ref 36.0–46.0)
Hemoglobin: 13 g/dL (ref 12.0–15.0)
MCH: 29.7 pg (ref 26.0–34.0)
MCHC: 32.5 g/dL (ref 30.0–36.0)
MCV: 91.5 fL (ref 80.0–100.0)
NRBC: 0 % (ref 0.0–0.2)
PLATELETS: 247 10*3/uL (ref 150–400)
RBC: 4.37 MIL/uL (ref 3.87–5.11)
RDW: 12 % (ref 11.5–15.5)
WBC: 9 10*3/uL (ref 4.0–10.5)

## 2019-01-15 LAB — LIPASE, BLOOD: LIPASE: 32 U/L (ref 11–51)

## 2019-01-15 MED ORDER — ALUM & MAG HYDROXIDE-SIMETH 200-200-20 MG/5ML PO SUSP
30.0000 mL | Freq: Once | ORAL | Status: AC
  Administered 2019-01-15: 30 mL via ORAL
  Filled 2019-01-15: qty 30

## 2019-01-15 MED ORDER — SODIUM CHLORIDE 0.9% FLUSH: 3.0000 mL | Freq: Once | INTRAVENOUS | Status: DC

## 2019-01-15 MED ORDER — OMEPRAZOLE 20 MG PO CPDR: 20.0000 mg | DELAYED_RELEASE_CAPSULE | Freq: Two times a day (BID) | ORAL | 0 refills | Status: AC

## 2019-01-15 MED ORDER — LIDOCAINE VISCOUS HCL 2 % MT SOLN
15.0000 mL | Freq: Once | OROMUCOSAL | Status: AC
  Administered 2019-01-15: 15 mL via ORAL
  Filled 2019-01-15: qty 15

## 2019-01-15 NOTE — ED Triage Notes (Signed)
Pt report lower abd pain that started 8 days ago, denies n/v. Pt a.o, nad noted

## 2019-01-15 NOTE — ED Provider Notes (Signed)
[facility] EMERGENCY DEPARTMENT Provider Note   CSN: [id number] Arrival date & time: 01/15/19  1546    History   Chief Complaint Chief Complaint  Patient presents with  . Abdominal Pain    HPI Timera Hoblit is a 43 y.o. female.     Patient is a 43 year old female with no significant past medical history.  She presents today with complaints of pain in her abdomen.  She describes a burning sensation in the "pit of her stomach" that radiates up to her throat.  This has been worsening over the past week and is worsening.  She denies having tried any medications.  She denies any fevers or chills.  She does state that spicy foods seem to make her symptoms worse.  Patient does not speak [place], only [place].  History was taken with the use of the translator tablet.  The history is provided by the patient.  Abdominal Pain  Pain location:  Epigastric Pain quality: burning   Pain radiates to:  Does not radiate Pain severity:  Moderate Onset quality:  Gradual Duration:  1 week Timing:  Constant Progression:  Worsening Chronicity:  New Context: not sick contacts   Relieved by:  Nothing Exacerbated by: Spicy foods.   Past Medical History:  Diagnosis Date  . Herpes   . Pyelonephritis complicating pregnancy, antepartum 09/20/2011    Patient Active Problem List   Diagnosis Date Noted  . Health care maintenance 02/20/2016  . Dental caries 02/20/2016  . Vaginal discharge 06/23/2011  . 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 Overweight (BMI 25.0-29.9) 05/10/2011    Past Surgical History:  Procedure Laterality Date  . APPENDECTOMY     pt was 43 yo     OB History    Gravida  4   Para  4   Term  4   Preterm  0   AB  0   Living  4     SAB  0   TAB  0   Ectopic  0   Multiple  0   Live Births  4            Home Medications    Prior to Admission medications   Medication Sig Start Date End Date Taking? Authorizing Provider  acyclovir  (ZOVIRAX) 400 MG tablet Take 1 tablet (400 mg total) by mouth 4 (four) times daily. 04/04/17   Elpidio Anis, PA-C  HYDROcodone-acetaminophen (NORCO/VICODIN) 5-325 MG tablet Take 1 tablet by mouth every 4 (four) hours as needed. 04/14/17   Rumley, [place] N, DO  lidocaine (XYLOCAINE) 5 % ointment Apply 1 application topically as needed. 04/14/17   Rumley, [place] N, DO  metroNIDAZOLE (FLAGYL) 500 MG tablet Take 1 tablet (500 mg total) by mouth 2 (two) times daily. 04/14/17   Rumley, Lora Havens, DO  neomycin-polymyxin-hydrocortisone (CORTISPORIN) 3.5-10000-1 otic suspension Place 4 drops into both ears 4 (four) times daily. X 7 days 11/03/15   Dione Booze, MD  Prenatal Vit-Fe Fumarate-FA (PRENATAL MULTIVITAMIN) TABS Take 1 tablet by mouth daily.    [provider]    Family History No family history on file.  Social History Social History   Tobacco Use  . Smoking status: Never Smoker  . Smokeless tobacco: Never Used  Substance Use Topics  . Alcohol use: No  . Drug use: No     Allergies   Patient has no known allergies.   Review of Systems Review of Systems  Gastrointestinal: Positive for abdominal pain.  All other  systems reviewed and are negative.    Physical Exam Updated Vital Signs BP 128/67 (BP Location: Right Arm)   Pulse 68   Temp 97.6 F (36.4 C) (Oral)   Resp 16   SpO2 98%   Physical Exam Vitals signs and nursing note reviewed.  Constitutional:      General: She is not in acute distress.    Appearance: She is well-developed. She is not diaphoretic.  HENT:     Head: Normocephalic and atraumatic.  Neck:     Musculoskeletal: Normal range of motion and neck supple.  Cardiovascular:     Rate and Rhythm: Normal rate and regular rhythm.     Heart sounds: No murmur. No friction rub. No gallop.   Pulmonary:     Effort: Pulmonary effort is normal. No respiratory distress.     Breath sounds: Normal breath sounds. No wheezing.  Abdominal:     General: Bowel  sounds are normal. There is no distension.     Palpations: Abdomen is soft.     Tenderness: There is no abdominal tenderness.  Musculoskeletal: Normal range of motion.  Skin:    General: Skin is warm and dry.  Neurological:     Mental Status: She is alert and oriented to person, place, and time.      ED Treatments / Results  Labs (all labs ordered are listed, but only abnormal results are displayed) Labs Reviewed  COMPREHENSIVE METABOLIC PANEL - Abnormal; Notable for the following components:      Result Value   Glucose, Bld 104 (*)    Total Bilirubin 0.2 (*)    All other components within normal limits  LIPASE, BLOOD  CBC  URINALYSIS, ROUTINE W REFLEX MICROSCOPIC  I-STAT BETA HCG BLOOD, ED ([facility], [facility], [facility] ONLY)    EKG None  Radiology No results found.  Procedures Procedures (including critical care time)  Medications Ordered in ED Medications  sodium chloride flush (NS) 0.9 % injection 3 mL (has no administration in time range)  alum & mag hydroxide-simeth (MAALOX/MYLANTA) 200-200-20 MG/5ML suspension 30 mL (has no administration in time range)    And  lidocaine (XYLOCAINE) 2 % viscous mouth solution 15 mL (has no administration in time range)     Initial Impression / Assessment and Plan / ED Course  I have reviewed the triage vital signs and the nursing notes.  Pertinent labs & imaging results that were available during my care of the patient were reviewed by me and considered in my medical decision making (see chart for details).  Patient presents with esophageal and epigastric burning that I suspect is related to acid reflux.  Her work-up reveals unremarkable laboratory studies.  I see no indication for imaging studies as her abdomen is benign.  She is feeling better after GI cocktail.  She will be discharged with antacid medication and follow-up with primary doctor as needed.  Final Clinical Impressions(s) / ED Diagnoses   Final diagnoses:  None    ED  Discharge Orders    None       Geoffery Lyons, MD 01/15/19 7867

## 2019-01-15 NOTE — Discharge Instructions (Addendum)
Prilosec as prescribed.  Follow-up with your primary doctor if symptoms or not improving in the next week, and return to the ER if your symptoms significantly worsen or change.

## 2019-01-24 ENCOUNTER — Other Ambulatory Visit: Payer: Self-pay

## 2019-01-24 ENCOUNTER — Encounter: Payer: Self-pay | Admitting: Family Medicine

## 2019-01-24 ENCOUNTER — Ambulatory Visit (INDEPENDENT_AMBULATORY_CARE_PROVIDER_SITE_OTHER): Payer: Self-pay | Admitting: Family Medicine

## 2019-01-24 VITALS — BP 102/62 | HR 73 | Temp 98.2°F | Ht 63.0 in | Wt 152.4 lb

## 2019-01-24 DIAGNOSIS — Z0000 Encounter for general adult medical examination without abnormal findings: Secondary | ICD-10-CM | POA: Insufficient documentation

## 2019-01-24 DIAGNOSIS — Z1239 Encounter for other screening for malignant neoplasm of breast: Secondary | ICD-10-CM | POA: Insufficient documentation

## 2019-01-24 DIAGNOSIS — K219 Gastro-esophageal reflux disease without esophagitis: Secondary | ICD-10-CM

## 2019-01-24 HISTORY — DX: Encounter for other screening for malignant neoplasm of breast: Z12.39

## 2019-01-24 HISTORY — DX: Gastro-esophageal reflux disease without esophagitis: K21.9

## 2019-01-24 NOTE — Assessment & Plan Note (Signed)
Resolved.  Currently on PPI. - Advised to discontinue omeprazole 20 mg daily in 1 week to complete 2-week treatment and restart on an as-needed basis

## 2019-01-24 NOTE — Progress Notes (Signed)
   Subjective   Patient ID: Amy Salazar    DOB: 19-Feb-1976, 43 y.o. female   MRN: [id number] Spanish [employer] interpreter used during interview: Derek Mound #[id number]  CC: "Yearly visit"  HPI: Amy Salazar is a 43 y.o. female who presents to clinic today for the following:  Annual physical: Amy Salazar is here for her annual physical exam.  This is her first time seeing me.  She has no concerns today.  She was recently seen in the ED for GERD which has resolved with her PPI.  She is due for her flu shot and Pap smear.  She is also interested in breast cancer screening today.  She did recently receive labs during her ED visit 1 week ago.  She is a non-smoker and denies alcohol or illicit drug use.  She denies symptoms of fevers or chills, fatigue, chest pain, shortness of breath, nausea or vomiting, diarrhea or constipation, abdominal pain, dysuria.  Breast cancer screening: She is interested in starting her mammograms this year.  She has no prior history of screening.  She has no family history of breast cancer.  She has no unexplained weight loss.  GERD: Currently on omeprazole with resolution of reflux-like symptoms including epigastric pain.  ROS: see HPI for pertinent.  PMFSH: Reviewed. Smoking status reviewed. Medications reviewed.  Objective   BP 102/62   Pulse 73   Temp 98.2 F (36.8 C) (Oral)   Ht 5\' 3"  (1.6 m)   Wt 152 lb 6.4 oz (69.1 kg)   SpO2 99%   BMI 27.00 kg/m  Vitals and nursing note reviewed.  General: well nourished, well developed, NAD with non-toxic appearance HEENT: normocephalic, atraumatic, moist mucous membranes Neck: supple, non-tender without lymphadenopathy Cardiovascular: regular rate and rhythm without murmurs, rubs, or gallops Lungs: clear to auscultation bilaterally with normal work of breathing Abdomen: soft, non-tender, non-distended, normoactive bowel sounds Skin: warm, dry, no rashes or lesions, cap refill < 2 seconds Extremities: warm and  well perfused, normal tone, no edema  Assessment & Plan   Encounter for annual physical exam New acute problems.  Up-to-date on vaccinations with exception to flu. - Does not have insurance, not given flu - Discussed breast cancer screening, given information to schedule screening mammogram - RTC 1 month for Pap or sooner if needed  Gastroesophageal reflux disease Resolved.  Currently on PPI. - Advised to discontinue omeprazole 20 mg daily in 1 week to complete 2-week treatment and restart on an as-needed basis  Orders Placed This Encounter  Procedures  . MM DIGITAL SCREENING BILATERAL    Standing Status:   Future    Standing Expiration Date:   04/26/2019    Order Specific Question:   Reason for Exam (SYMPTOM  OR DIAGNOSIS REQUIRED)    Answer:   Screening    Order Specific Question:   Is the patient pregnant?    Answer:   No    Order Specific Question:   Preferred imaging location?    Answer:   [facility]   No orders of the defined types were placed in this encounter.   Durward Parcel, DO [facility], PGY-3 01/24/2019, 4:44 PM

## 2019-01-24 NOTE — Patient Instructions (Addendum)
Gracias por venir a vernos hoy. Consulte a continuacin para revisar nuestro plan para la visita de hoy.  1. Contine con el omeprazol durante 1 semana ms, luego interrumpa el medicamento y tome [employer] base segn sea necesario. 2. [name] est al da de su vacuna contra la gripe. 3. Por favor llame al nmero que le proporcion para programar una mamografa de deteccin. 4. Te volveremos a ver en 1 mes para tu Papanicolaou.  Llame a la clnica al [phone] si sus sntomas empeoran o si tiene alguna inquietud. Fue Regulatory affairs officer.  Durward Parcel, DO [facility], PGY-3     Thank you for coming in to see Amy Salazar today. Please see below to review our plan for today's visit.  1.  Continue the omeprazole for 1 more week, then discontinue the medication and take on an as-needed basis. 2.  You are now up-to-date on your flu shot. 3.  Please call the number I provided you to schedule a screening mammogram. 4.  We will see you again in 1 month for your Pap smear.  Please call the clinic at [phone] if your symptoms worsen or you have any concerns. It was our pleasure to serve you.  Durward Parcel, DO [facility], PGY-3

## 2019-01-24 NOTE — Assessment & Plan Note (Addendum)
New acute problems.  Up-to-date on vaccinations with exception to flu. - Does not have insurance, not given flu - Discussed breast cancer screening, given information to schedule screening mammogram - RTC 1 month for Pap or sooner if needed

## 2019-03-07 ENCOUNTER — Ambulatory Visit: Payer: Self-pay | Admitting: Family Medicine

## 2019-10-16 ENCOUNTER — Telehealth (INDEPENDENT_AMBULATORY_CARE_PROVIDER_SITE_OTHER): Payer: Self-pay | Admitting: Family Medicine

## 2019-10-16 ENCOUNTER — Other Ambulatory Visit: Payer: Self-pay

## 2019-10-16 DIAGNOSIS — N898 Other specified noninflammatory disorders of vagina: Secondary | ICD-10-CM

## 2019-10-16 DIAGNOSIS — R197 Diarrhea, unspecified: Secondary | ICD-10-CM

## 2019-10-16 MED ORDER — FLUCONAZOLE 150 MG PO TABS: 150.0000 mg | ORAL_TABLET | Freq: Once | ORAL | 0 refills | Status: AC

## 2019-10-16 NOTE — Progress Notes (Signed)
Virtual Visit via Telephone Note  I connected with Amy Salazar on 10/16/19 at  4:10 PM EST by telephone and verified that I am speaking with the correct person using two identifiers.  Location: Patient: on phone  Provider: [facility] clinic   I discussed the limitations, risks, security and privacy concerns of performing an evaluation and management service by telephone and the availability of in person appointments. I also discussed with the patient that there may be a patient responsible charge related to this service. The patient expressed understanding and agreed to proceed.   History of Present Illness: Patient had scheduled phone visit to originally discussed diarrhea.  She says occasionally if she eats certain spicy foods she gets some diarrhea which self resolves within a few hours.  We discussed that she could try some Tums and not eating as much spicy foods which she seems amenable to.  She then describes that she has some increased discharge and vaginal itching.  We discussed that I cannot diagnose over the phone but as she denies any new sexual contacts it is possible that this could be a yeast infection and I would like to prescribe Diflucan for that.  We discussed that if this does not resolve it she should need to come in for a swab so that we can try to determine more exactly what is causing her problem.  She denies any lesions or sores or dysuria   Observations/Objective: Speaking in full sentences, pleasant, no distress Assessment and Plan: It is possible this is a yeast infection given her description although unable to know for sure.  As treatment is benign we will offer Diflucan prophylactically.  Can follow-up in person if this does not resolve  Follow Up Instructions:    I discussed the assessment and treatment plan with the patient. The patient was provided an opportunity to ask questions and all were answered. The patient agreed with the plan and demonstrated an  understanding of the instructions.   The patient was advised to call back or seek an in-person evaluation if the symptoms worsen or if the condition fails to improve as anticipated.  I provided 12 minutes of non-face-to-face time during this encounter.   Sherene Sires, DO

## 2019-10-22 ENCOUNTER — Other Ambulatory Visit (HOSPITAL_COMMUNITY)
Admission: RE | Admit: 2019-10-22 | Discharge: 2019-10-22 | Disposition: A | Discharge status: Home / Self Care | Payer: Self-pay | Source: Ambulatory Visit | Attending: Family Medicine | Admitting: Family Medicine

## 2019-10-22 ENCOUNTER — Other Ambulatory Visit: Payer: Self-pay

## 2019-10-22 ENCOUNTER — Ambulatory Visit (INDEPENDENT_AMBULATORY_CARE_PROVIDER_SITE_OTHER): Payer: Self-pay | Admitting: Family Medicine

## 2019-10-22 VITALS — BP 122/74 | HR 61

## 2019-10-22 DIAGNOSIS — B9689 Other specified bacterial agents as the cause of diseases classified elsewhere: Secondary | ICD-10-CM | POA: Insufficient documentation

## 2019-10-22 DIAGNOSIS — N898 Other specified noninflammatory disorders of vagina: Secondary | ICD-10-CM | POA: Insufficient documentation

## 2019-10-22 DIAGNOSIS — N760 Acute vaginitis: Secondary | ICD-10-CM

## 2019-10-22 LAB — POCT WET PREP (WET MOUNT)
Clue Cells Wet Prep Whiff POC: NEGATIVE
Trichomonas Wet Prep HPF POC: ABSENT

## 2019-10-22 LAB — POCT URINALYSIS DIP (MANUAL ENTRY)
Bilirubin, UA: NEGATIVE
Glucose, UA: NEGATIVE mg/dL
Ketones, POC UA: NEGATIVE mg/dL
Leukocytes, UA: NEGATIVE
Nitrite, UA: NEGATIVE
Protein Ur, POC: NEGATIVE mg/dL
Spec Grav, UA: >=1.03 — AB (ref 1.010–1.025)
Urobilinogen, UA: 1 E.U./dL
pH, UA: 6.5 (ref 5.0–8.0)

## 2019-10-22 MED ORDER — METRONIDAZOLE 500 MG PO TABS: 500.0000 mg | ORAL_TABLET | Freq: Two times a day (BID) | ORAL | 0 refills | Status: AC

## 2019-10-22 NOTE — Assessment & Plan Note (Signed)
She seems to have 2 separate problems going on: Vaginal irritation and mild stomach pain with loose stools.  She has not noticed any association between her mild abdominal pain and her vaginal irritation.  She is not septic.  She does not demonstrate chandelier sign or evidence of PID.  She reports that this irritation feels very similar to her previous episode of bacterial vaginosis.  Wet prep showed no evidence of yeast or clue cells though many bacteria were present.  Will treat for BV at this time with 7 days of metronidazole.  She was encouraged to return to clinic if symptoms do not improve with antibiotics. -Metronidazole 500 mg twice daily for 7 days -Follow-up HIV, RPR, gonorrhea/chlamydia 

## 2019-10-22 NOTE — Assessment & Plan Note (Deleted)
She seems to have 2 separate problems going on: Vaginal irritation and mild stomach pain with loose stools.  She has not noticed any association between her mild abdominal pain and her vaginal irritation.  She is not septic.  She does not demonstrate chandelier sign or evidence of PID.  She reports that this irritation feels very similar to her previous episode of bacterial vaginosis.  Wet prep showed no evidence of yeast or clue cells though many bacteria were present.  Will treat for BV at this time with 7 days of metronidazole.  She was encouraged to return to clinic if symptoms do not improve with antibiotics. -Metronidazole 500 mg twice daily for 7 days -Follow-up HIV, RPR, gonorrhea/chlamydia

## 2019-10-22 NOTE — Progress Notes (Signed)
Subjective:  Amy Salazar is a 43 y.o. female who presents to the [facility] today with a chief complaint of  Vaginal irritation.   HPI:  Vaginal irritation She reports 2 weeks of vaginal irritation and itching.  She has not noted any significant discharge or malodor.  She is sexually active.  This feels very similar to an episode of bacterial vaginosis she had several years ago.  In addition to irritation and itching, she notices stomach and back pain that seems to be related to her bowel movements.  She typically has mild pain in her abdomen and back prior to a loose bowel movement.  She reports that she has been having as many as 3 loose stools per day.  This has been going on for roughly the past week.   Chief Complaint noted Review of Symptoms - see HPI PMH -  previous episodes of bacterial vaginosis and HSV.  She reports that her last menstrual period finished 11/29.  Objective:  Physical Exam: BP 122/74   Pulse 61   LMP 10/16/2019 (Approximate)   SpO2 99%    General: No acute distress, resting comfortably on exam table. GU: External genitalia normal-appearing without rash or erythema.  Speculum inserted without difficulty.  Vaginal mucosa intact, moist, well-appearing.  Small amount of blood noted coming from cervical os.  Mild leukorrhea.  Samples taken.  Bimanual exam performed, no adnexal tenderness, no enlarged uterus.  No cervical motion tenderness.  Results for orders placed or performed in visit on 10/22/19 (from the past 72 hour(s))  POCT urinalysis dipstick     Status: Abnormal   Collection Time: 10/22/19  4:22 PM  Result Value Ref Range   Color, UA yellow yellow   Clarity, UA clear clear   Glucose, UA negative negative mg/dL   Bilirubin, UA negative negative   Ketones, POC UA negative negative mg/dL   Spec Grav, UA >=1.030 (A) 1.010 - 1.025   Blood, UA moderate (A) negative   pH, UA 6.5 5.0 - 8.0   Protein Ur, POC negative negative mg/dL   Urobilinogen, UA  1.0 0.2 or 1.0 E.U./dL   Nitrite, UA Negative Negative   Leukocytes, UA Negative Negative  POCT Wet Prep [facility])     Status: Abnormal   Collection Time: 10/22/19  4:28 PM  Result Value Ref Range   Source Wet Prep POC vaginal    WBC, Wet Prep HPF POC 5-10    Bacteria Wet Prep HPF POC Many (A) Few   Clue Cells Wet Prep HPF POC None None   Clue Cells Wet Prep Whiff POC Negative Whiff    Yeast Wet Prep HPF POC None None   KOH Wet Prep POC None None   Trichomonas Wet Prep HPF POC Absent Absent     Assessment/Plan:  Vaginal irritation She seems to have 2 separate problems going on: Vaginal irritation and mild stomach pain with loose stools.  She has not noticed any association between her mild abdominal pain and her vaginal irritation.  She is not septic.  She does not demonstrate chandelier sign or evidence of PID.  She reports that this irritation feels very similar to her previous episode of bacterial vaginosis.  Wet prep showed no evidence of yeast or clue cells though many bacteria were present.  Will treat for BV at this time with 7 days of metronidazole.  She was encouraged to return to clinic if symptoms do not improve with antibiotics. -Metronidazole 500 mg twice daily  for 7 days -Follow-up HIV, RPR, gonorrhea/chlamydia

## 2019-10-22 NOTE — Patient Instructions (Addendum)
Le vamos a tratar para una infeccion de bacteria en la vagina.  Por favor regrese si sigan los sintomas despues de terminar los antibioticos.

## 2019-10-23 LAB — RPR: RPR Ser Ql: NONREACTIVE

## 2019-10-23 LAB — CERVICOVAGINAL ANCILLARY ONLY
Chlamydia: NEGATIVE
Comment: NEGATIVE
Comment: NORMAL
Neisseria Gonorrhea: NEGATIVE

## 2019-10-23 LAB — HIV ANTIBODY (ROUTINE TESTING W REFLEX): HIV Screen 4th Generation wRfx: NONREACTIVE

## 2019-11-13 ENCOUNTER — Other Ambulatory Visit: Payer: Self-pay

## 2019-11-13 ENCOUNTER — Ambulatory Visit (INDEPENDENT_AMBULATORY_CARE_PROVIDER_SITE_OTHER): Payer: Self-pay | Admitting: Family Medicine

## 2019-11-13 ENCOUNTER — Encounter: Payer: Self-pay | Admitting: Family Medicine

## 2019-11-13 VITALS — BP 112/62 | HR 62 | Wt 161.4 lb

## 2019-11-13 DIAGNOSIS — N898 Other specified noninflammatory disorders of vagina: Secondary | ICD-10-CM

## 2019-11-13 LAB — POCT WET PREP (WET MOUNT)
Clue Cells Wet Prep Whiff POC: NEGATIVE
Trichomonas Wet Prep HPF POC: ABSENT

## 2019-11-13 MED ORDER — FLUCONAZOLE 150 MG PO TABS: 150.0000 mg | ORAL_TABLET | Freq: Once | ORAL | 0 refills | Status: AC

## 2019-11-13 MED ORDER — VALACYCLOVIR HCL 1 G PO TABS: 1000.0000 mg | ORAL_TABLET | Freq: Every day | ORAL | 0 refills | Status: DC

## 2019-11-13 NOTE — Patient Instructions (Signed)
It was great seeing you today!  I am sorry about your vaginal itching.  I do not see anything out of the ordinary on your wet prep.  For this we will go ahead and treat you for a yeast infection based on clinical symptoms.  Along with this I will go ahead and also treat you for herpes as I noticed back in 2018 he had similar symptoms and the Valtrex seem to help.  I will send these medications to your pharmacy, [place] [facility]. ---------------------------------------------------------------------------------------------------------  Fue genial verte hoy! Lamento tu picazn vaginal. No veo nada fuera de lo comn en su preparacin hmeda. Para esto, seguiremos adelante y lo trataremos para una infeccin por hongos en funcin de los sntomas clnicos. Junto con esto, seguir adelante y tambin lo tratar para el herpes, ya que not en 2018 que tena sntomas similares y Valtrex parece ayudar.  Enviar estos medicamentos a su farmacia, [facility].

## 2019-11-16 ENCOUNTER — Encounter: Payer: Self-pay | Admitting: Family Medicine

## 2019-11-16 NOTE — Progress Notes (Signed)
  Spanish interpreter utilized via [employer] interpreters  HPI 43 year old female who presents for vaginal irritation follow-up.  She was initially evaluated on 10/16/2019 for vaginal discharge.  She was given a dose of Diflucan to take for a presumed yeast infection.  Her symptoms did not improve and she follow-up in the office on 10/22/2019.  She had some bacteria on wet prep but no other symptoms.  She was treated for BV with Flagyl at that appointment.  She again returns with similar complaints.  She states that Flagyl improve things "a little bit" but the pain is still there.  She describes the pain is itching sensation on bilateral labia with mild internal pruritus.  She has not noticed any external manifestations aside from the itching.  Of note she did say that this does feel a little bit similar to presumed HSV outbreak she had back in 2018.  She was given Valtrex at that time which did improve her symptoms.  CC: Vaginal itching  ROS:   Review of Systems See HPI for ROS.   CC, SH/smoking status, and VS noted  Objective: BP 112/62   Pulse 62   Wt 161 lb 6.4 oz (73.2 kg)   LMP 10/16/2019 (Approximate)   SpO2 99%   BMI 28.59 kg/m  Gen: 43 year old [place] female, no acute distress, resting comfortably CV: Skin warm and dry Resp: No accessory muscle use, no respiratory distress Neuro: Alert and oriented, Speech clear, No gross deficits GU: Speculum easily inserted.  No vesicular rash, developing boils or abscesses.  Mild erythema and excoriation noted from obvious itching.  Wet prep performed which again only showed many bacteria  Assessment and plan:  Vaginal irritation Negative wet prep.  Patient symptoms fit very well with a yeast infection but she did not get any improvement from this with Diflucan.  Will retry the Diflucan 150 mg but give 2 doses instead.  It is possible that she has a strain resistant to Diflucan.  We will also give her Valtrex suppressive therapy 1 g  daily for 5 days given that the symptoms feel very similar to an HSV outbreak in the past.  She could directly be having herpetic neuralgia type symptoms before some external manifestations.  Patient can follow-up with me over the phone and does not improve.   Orders Placed This Encounter  Procedures  . POCT Wet Prep [facility])    Meds ordered this encounter  Medications  . fluconazole (DIFLUCAN) 150 MG tablet    Sig: Take 1 tablet (150 mg total) by mouth once for 1 dose.    Dispense:  2 tablet    Refill:  0  . valACYclovir (VALTREX) 1000 MG tablet    Sig: Take 1 tablet (1,000 mg total) by mouth daily.    Dispense:  5 tablet    Refill:  0     Guadalupe Dawn MD PGY-3 Family Medicine Resident  11/16/2019 11:54 PM

## 2019-11-16 NOTE — Assessment & Plan Note (Signed)
Negative wet prep.  Patient symptoms fit very well with a yeast infection but she did not get any improvement from this with Diflucan.  Will retry the Diflucan 150 mg but give 2 doses instead.  It is possible that she has a strain resistant to Diflucan.  We will also give her Valtrex suppressive therapy 1 g daily for 5 days given that the symptoms feel very similar to an HSV outbreak in the past.  She could directly be having herpetic neuralgia type symptoms before some external manifestations.  Patient can follow-up with me over the phone and does not improve.

## 2019-11-26 ENCOUNTER — Other Ambulatory Visit: Payer: Self-pay

## 2019-11-26 ENCOUNTER — Ambulatory Visit (INDEPENDENT_AMBULATORY_CARE_PROVIDER_SITE_OTHER): Payer: Self-pay | Admitting: Family Medicine

## 2019-11-26 VITALS — BP 122/70 | HR 71 | Wt 162.2 lb

## 2019-11-26 DIAGNOSIS — N898 Other specified noninflammatory disorders of vagina: Secondary | ICD-10-CM

## 2019-11-26 MED ORDER — HYDROCORTISONE 2.5 % EX OINT: TOPICAL_OINTMENT | Freq: Two times a day (BID) | CUTANEOUS | 0 refills | Status: DC

## 2019-11-26 MED ORDER — VALACYCLOVIR HCL 1 G PO TABS: 1000.0000 mg | ORAL_TABLET | Freq: Every day | ORAL | 0 refills | Status: DC

## 2019-11-26 NOTE — Patient Instructions (Addendum)
It was great seeing you again today!  I am sorry about the continued pain and vaginal itching.  We performed a swab to check for herpes infection.  In the meantime we will treat you for this with another round of the Valtrex 1 g daily.  To help with the itching we can try hydrocortisone 2.5% ointment as this should help some.  Please let me know if your symptoms do not improve.   Fue genial volver a verte hoy! Lamento el dolor continuo y la picazn vaginal. Realizamos un hisopo para verificar si haba infeccin por herpes. Mientras tanto, lo trataremos con otra ronda de Valtrex 1 g al da. Para ayudar con la picazn, podemos probar la pomada de hidrocortisona al 2.5%, ya que esto debera ayudar a algunos. Avseme si sus sntomas no mejoran.

## 2019-12-03 ENCOUNTER — Encounter: Payer: Self-pay | Admitting: Family Medicine

## 2019-12-03 NOTE — Progress Notes (Signed)
  Spanish interpreter used throughout entirety of exam  HPI 44 year old female who presents for vaginal itching and pain.  Patient last seen by me on 11/13/2019.  She was given Diflucan for possible yeast infection as well as Valtrex for possible herpes infection.  Patient states that she finished a course of Valtrex after finishing that dose developed a "full-blown herpes infection ".  She states that she has had white vesicular areas develop around her vagina and perineum.  CC: Vaginal itching, pain   ROS:   Review of Systems See HPI for ROS.   CC, SH/smoking status, and VS noted  Objective: BP 122/70   Pulse 71   Wt 162 lb 3.2 oz (73.6 kg)   SpO2 99%   BMI 28.73 kg/m  Gen: 44 year old Latino female, no acute distress resting comfortably CV: Skin warm and dry Resp: No accessory muscle use, no respiratory distress Abd: SNTND, BS present, no guarding or organomegaly Ext: No edema, warm Neuro: Alert and oriented, Speech clear, No gross deficits GU: Cervix well visualized.  No vesicular rash as described by patient appreciated.  Mild excoriation and erythema noted.   Assessment and plan:  Vaginal irritation Swab for HSV.  Swab was negative for HSV.  No clear vesicular rash noted.  Patient I believe that she has a herpes outbreak so we will treat again with Valtrex 1 g for 5 days.  Can try harder hydrocortisone cream in the off chance this could be allergic in nature.  Patient let me know if itching does not improve, at that time will likely refer to gynecology for failure of treatment   Orders Placed This Encounter  Procedures  . Herpes Simplex Virus Culture  . Specimen status report    Meds ordered this encounter  Medications  . valACYclovir (VALTREX) 1000 MG tablet    Sig: Take 1 tablet (1,000 mg total) by mouth daily.    Dispense:  5 tablet    Refill:  0  . hydrocortisone 2.5 % ointment    Sig: Apply topically 2 (two) times daily.    Dispense:  30 g    Refill:  0    Myrene Buddy MD PGY-3 Family Medicine Resident  12/03/2019 1:37 PM

## 2019-12-03 NOTE — Assessment & Plan Note (Signed)
Swab for HSV.  Swab was negative for HSV.  No clear vesicular rash noted.  Patient I believe that she has a herpes outbreak so we will treat again with Valtrex 1 g for 5 days.  Can try harder hydrocortisone cream in the off chance this could be allergic in nature.  Patient let me know if itching does not improve, at that time will likely refer to gynecology for failure of treatment

## 2019-12-06 LAB — SPECIMEN STATUS REPORT

## 2019-12-14 ENCOUNTER — Other Ambulatory Visit: Payer: Self-pay

## 2019-12-14 ENCOUNTER — Telehealth (INDEPENDENT_AMBULATORY_CARE_PROVIDER_SITE_OTHER): Payer: Self-pay | Admitting: Family Medicine

## 2019-12-14 DIAGNOSIS — Z79899 Other long term (current) drug therapy: Secondary | ICD-10-CM

## 2019-12-14 DIAGNOSIS — N898 Other specified noninflammatory disorders of vagina: Secondary | ICD-10-CM

## 2019-12-15 LAB — HERPES SIMPLEX VIRUS CULTURE

## 2019-12-18 NOTE — Progress Notes (Signed)
[facility] Telemedicine Visit  Spanish interpreter utilized via [facility] interpreter service during entirety of exam Patient consented to have virtual visit. Method of visit: Telephone  Encounter participants: Patient: Amy Salazar - located at home Provider: Myrene Buddy - located at [facility] Others (if applicable): [employer] interpreter on phone  Chief Complaint: Vaginal itching  HPI: 44 year old female presents as telemedicine visit for follow-up for vaginal itching and pain.  Patient well-known to me is absent her several times for this issue.  Has been treated for yeast, BV, herpes within the last couple of months for this issue.  No other treatments have helped at all.  Has had multiple wet preps performed which were negative.  Has had GC/chlamydia, RPR, HIV testing which were all negative as well.  Given possibilities might be allergic topical steroid cream was prescribed as well.  ROS: per HPI  Pertinent PMHx: Vaginal itching  Exam:  General: No distress over phone, very pleasant Respiratory: No wheezing appreciated over phone call  Assessment/Plan:  Vaginal irritation At this point point unclear etiology of patient's vaginal itching is no testing has revealed obvious pathology.  Similarly no treatment given empirical has been helpful for her.  Will make referral to OB/GYN.  No charge for visit Discussed plan with Dr. Earlene Plater who is in agreement  Time spent during visit with patient: 8 minutes

## 2019-12-18 NOTE — Assessment & Plan Note (Signed)
At this point point unclear etiology of patient's vaginal itching is no testing has revealed obvious pathology.  Similarly no treatment given empirical has been helpful for her.  Will make referral to OB/GYN.  No charge for visit

## 2019-12-20 ENCOUNTER — Telehealth: Payer: Self-pay | Admitting: *Deleted

## 2019-12-20 NOTE — Telephone Encounter (Signed)
Addended previous encounter to add gynecology referral. This was not entered in error during the previous telemedicine visit.  Myrene Buddy MD PGY-3 Family Medicine Resident

## 2019-12-20 NOTE — Telephone Encounter (Signed)
Pt calling in to check on her OB/GYN referral, nothing in the chart. Please advise. Deseree Bruna Potter, CMA

## 2019-12-20 NOTE — Addendum Note (Signed)
Addended by: Delman Cheadle on: 12/20/2019 06:29 PM   Modules accepted: Orders

## 2020-02-08 ENCOUNTER — Ambulatory Visit (INDEPENDENT_AMBULATORY_CARE_PROVIDER_SITE_OTHER): Payer: Self-pay | Admitting: Obstetrics & Gynecology

## 2020-02-08 ENCOUNTER — Encounter: Payer: Self-pay | Admitting: Obstetrics & Gynecology

## 2020-02-08 ENCOUNTER — Other Ambulatory Visit: Payer: Self-pay

## 2020-02-08 VITALS — BP 110/63 | HR 65 | Wt 163.0 lb

## 2020-02-08 DIAGNOSIS — N898 Other specified noninflammatory disorders of vagina: Secondary | ICD-10-CM

## 2020-02-08 DIAGNOSIS — B9689 Other specified bacterial agents as the cause of diseases classified elsewhere: Secondary | ICD-10-CM

## 2020-02-08 DIAGNOSIS — R21 Rash and other nonspecific skin eruption: Secondary | ICD-10-CM

## 2020-02-08 DIAGNOSIS — N760 Acute vaginitis: Secondary | ICD-10-CM

## 2020-02-08 DIAGNOSIS — Z113 Encounter for screening for infections with a predominantly sexual mode of transmission: Secondary | ICD-10-CM

## 2020-02-08 NOTE — Progress Notes (Signed)
Was referred for a vaginal infection No pain but itching states feels like diaper rash itd red and hurts to touch states like a burning sensation.

## 2020-02-08 NOTE — Patient Instructions (Signed)
Regrese a la clinica cuando tenga su cita. Si tiene problemas o preguntas, llama a la clinica o vaya a la sala de emergencia al [facility].    

## 2020-02-08 NOTE — Progress Notes (Signed)
GYNECOLOGY OFFICE VISIT NOTE  History:   Amy Salazar is a 44 y.o. [phone] here today for evaluation of chronic rash on her perineum and vaginal discharge.  Patient is Spanish-speaking only, interpreter present for this encounter.  Symptoms present for the last three months. Has been see multiple times by her PCP, an initial wet prep showed BV and she was treated with Flagyl. She continued to have symptoms; subsequent wet preps were negative. Has been treated with more courses of Metronidazole, Diflucan and Hydrocortisone cream.  Even got treated with Valtrex; had negative HSV swab.  She still reports having same symptoms.  No new detergents, clothes, perfumes, soaps, not sexually acrive. No new foods or other potential irritants.  She denies any abnormal vaginal bleeding, pelvic pain or other concerns.    Past Medical History:  Diagnosis Date  . Gastroesophageal reflux disease 01/24/2019  . Herpes   . Pyelonephritis complicating pregnancy, antepartum 09/20/2011    Past Surgical History:  Procedure Laterality Date  . APPENDECTOMY     pt was 44 yo    The following portions of the patient's history were reviewed and updated as appropriate: allergies, current medications, past family history, past medical history, past social history, past surgical history and problem list.   Health Maintenance:  Normal pap and negative HRHPV in 2017.  Never had a mammogram.   Review of Systems:  Pertinent items noted in HPI and remainder of comprehensive ROS otherwise negative.  Physical Exam:  BP 110/63   Pulse 65   Wt 163 lb (73.9 kg)   LMP 02/08/2008   BMI 28.87 kg/m  CONSTITUTIONAL: Well-developed, well-nourished female in no acute distress.  MUSCULOSKELETAL: Normal range of motion. No edema noted. NEUROLOGIC: Alert and oriented to person, place, and time. Normal muscle tone coordination. No cranial nerve deficit noted. PSYCHIATRIC: Normal mood and affect. Normal behavior. Normal  judgment and thought content. CARDIOVASCULAR: Normal heart rate noted RESPIRATORY: Effort and breath sounds normal, no problems with respiration noted ABDOMEN: No masses noted. No other overt distention noted.   PELVIC: External genitalia with some thin, white discharge visible at introitus, testing sample obtained.  Normal appearing distal vaginal mucosa and cervix. Chronic inflammatory change with some hypopigmentation and evidence of past excoriation noted on vulva and upper inner thighs and bottom of her buttocks.  No active erythema or drainage, no discrete lesions seen.      Assessment and Plan:      1. Vaginal discharge - Cervicovaginal ancillary only( [place]) done, will follow up results and manage accordingly.  Proper vulvar hygiene emphasized: discussed avoidance of perfumed soaps, detergents, lotions and any type of douches; in addition to wearing cotton underwear and no underwear at night.  Also recommended cleaning front to back, voiding and cleaning up after intercourse.   2. Perineal rash in female Unknown etiology, could be chronic yeast infection or other inflammatory condition.  Will follow up results of testing.  May need trial of Nystatin cream or higher potency steroid cream such as Clobetasol.  If symptoms still persist, may need referral to Dermatology.  Routine preventative health maintenance measures emphasized. Mammogram scholarship given to patient, also gave her information about how to get a pap smear. Please refer to After Visit Summary for other counseling recommendations.   Return for any gynecologic concerns.    Total face-to-face time with patient: 20 minutes.  Over 50% of encounter was spent on counseling and coordination of care.    Jaynie Collins, MD,  [place] for [employer], [place]

## 2020-02-11 LAB — CERVICOVAGINAL ANCILLARY ONLY
Bacterial Vaginitis (gardnerella): POSITIVE — AB
Candida Glabrata: NEGATIVE
Candida Vaginitis: NEGATIVE
Chlamydia: NEGATIVE
Comment: NEGATIVE
Comment: NEGATIVE
Comment: NEGATIVE
Comment: NEGATIVE
Comment: NEGATIVE
Comment: NORMAL
Neisseria Gonorrhea: NEGATIVE
Trichomonas: NEGATIVE

## 2020-02-12 ENCOUNTER — Telehealth: Payer: Self-pay

## 2020-02-12 MED ORDER — METRONIDAZOLE 500 MG PO TABS: 500.0000 mg | ORAL_TABLET | Freq: Two times a day (BID) | ORAL | 2 refills | Status: DC

## 2020-02-12 NOTE — Telephone Encounter (Addendum)
-----   Message from Tereso Newcomer, MD sent at 02/12/2020  1:12 PM EDT ----- Vaginal discharge test is abnormal and showed bacterial vaginitis. Metronidazole prescribed. Unsure if this is causing her diffuse itching symptoms but will treat.  If itching/rash continues, may need Dermatology evaluation as she has been seen multiple times by her PCP and GYN.  Please inform patient of results and advise to pick up prescription and take as directed.  Spanish speaking.   Called pt with [facility] interpreter [place] [employer] [phone]. VM left stating I am calling with results from recent appt. Also notified pt she has a new prescription at her pharmacy. Callback number given and requested pt call for results.

## 2020-02-12 NOTE — Addendum Note (Signed)
Addended by: Jaynie Collins A on: 02/12/2020 01:12 PM   Modules accepted: Orders

## 2020-02-13 NOTE — Telephone Encounter (Addendum)
Called patient with assistance of [employer], Research officer, trade union. Left message on patients voicemail that has a prescription at her pharmacy. Patient informed to call with any questions or concerns as needed.

## 2020-02-14 ENCOUNTER — Other Ambulatory Visit: Payer: Self-pay | Admitting: Family Medicine

## 2020-02-14 DIAGNOSIS — Z1231 Encounter for screening mammogram for malignant neoplasm of breast: Secondary | ICD-10-CM

## 2020-03-04 ENCOUNTER — Other Ambulatory Visit: Payer: Self-pay

## 2020-03-04 ENCOUNTER — Ambulatory Visit
Admission: RE | Admit: 2020-03-04 | Discharge: 2020-03-04 | Disposition: A | Discharge status: Home / Self Care | Payer: No Typology Code available for payment source | Source: Ambulatory Visit | Attending: Family Medicine | Admitting: Family Medicine

## 2020-03-04 DIAGNOSIS — Z1231 Encounter for screening mammogram for malignant neoplasm of breast: Secondary | ICD-10-CM

## 2022-12-06 ENCOUNTER — Encounter (HOSPITAL_COMMUNITY): Payer: Self-pay | Admitting: Emergency Medicine

## 2022-12-06 ENCOUNTER — Observation Stay (HOSPITAL_COMMUNITY)
Admission: EM | Admit: 2022-12-06 | Discharge: 2022-12-09 | Disposition: A | Discharge status: Home / Self Care | Payer: No Typology Code available for payment source | Attending: Surgery | Admitting: Surgery

## 2022-12-06 DIAGNOSIS — K819 Cholecystitis, unspecified: Secondary | ICD-10-CM

## 2022-12-06 DIAGNOSIS — K810 Acute cholecystitis: Principal | ICD-10-CM | POA: Insufficient documentation

## 2022-12-06 HISTORY — DX: Other complications of anesthesia, initial encounter: T88.59XA

## 2022-12-06 LAB — URINALYSIS, ROUTINE W REFLEX MICROSCOPIC
Bilirubin Urine: NEGATIVE
Glucose, UA: NEGATIVE mg/dL
Ketones, ur: NEGATIVE mg/dL
Leukocytes,Ua: NEGATIVE
Nitrite: NEGATIVE
Protein, ur: NEGATIVE mg/dL
Specific Gravity, Urine: 1.029 (ref 1.005–1.030)
pH: 5 (ref 5.0–8.0)

## 2022-12-06 LAB — CBC
HCT: 42 % (ref 36.0–46.0)
Hemoglobin: 14.1 g/dL (ref 12.0–15.0)
MCH: 30 pg (ref 26.0–34.0)
MCHC: 33.6 g/dL (ref 30.0–36.0)
MCV: 89.4 fL (ref 80.0–100.0)
Platelets: 294 10*3/uL (ref 150–400)
RBC: 4.7 MIL/uL (ref 3.87–5.11)
RDW: 12.3 % (ref 11.5–15.5)
WBC: 9.1 10*3/uL (ref 4.0–10.5)
nRBC: 0 % (ref 0.0–0.2)

## 2022-12-06 LAB — COMPREHENSIVE METABOLIC PANEL
ALT: 14 U/L (ref 0–44)
AST: 17 U/L (ref 15–41)
Albumin: 4.2 g/dL (ref 3.5–5.0)
Alkaline Phosphatase: 88 U/L (ref 38–126)
Anion gap: 7 (ref 5–15)
BUN: 13 mg/dL (ref 6–20)
CO2: 25 mmol/L (ref 22–32)
Calcium: 9.5 mg/dL (ref 8.9–10.3)
Chloride: 106 mmol/L (ref 98–111)
Creatinine, Ser: 0.78 mg/dL (ref 0.44–1.00)
GFR, Estimated: >60 mL/min (ref >60)
Glucose, Bld: 106 mg/dL — ABNORMAL HIGH (ref 70–99)
Potassium: 3.9 mmol/L (ref 3.5–5.1)
Sodium: 138 mmol/L (ref 135–145)
Total Bilirubin: 0.4 mg/dL (ref 0.3–1.2)
Total Protein: 7.9 g/dL (ref 6.5–8.1)

## 2022-12-06 LAB — I-STAT BETA HCG BLOOD, ED (MC, WL, AP ONLY): I-stat hCG, quantitative: <5 m[IU]/mL (ref <5)

## 2022-12-06 LAB — LIPASE, BLOOD: Lipase: 32 U/L (ref 11–51)

## 2022-12-06 NOTE — ED Provider Triage Note (Signed)
Emergency Medicine Provider Triage Evaluation Note  Loleta Dicker , a 47 y.o. female  was evaluated in triage.  Pt complains of epigastric abdominal pain, occasional nausea, diarrhea for some time, worse today. Reports no nausea or vomiting.  Review of Systems  Positive: Abdominal pain, diarrhea Negative: Fever, chills  Physical Exam  BP 137/78   Pulse 61   Temp 98.1 F (36.7 C) (Oral)   Resp 18   LMP 03/03/2019   SpO2 100%  Gen:   Awake, no distress   Resp:  Normal effort  MSK:   Moves extremities without difficulty  Other: 1 some tenderness to palpation epigastric region, no rebound, rigidity, guarding, no Murphy sign noted  Medical Decision Making  Medically screening exam initiated at 3:23 PM.  Appropriate orders placed.  Cecil Cranker Alvarado was informed that the remainder of the evaluation will be completed by another provider, this initial triage assessment does not replace that evaluation, and the importance of remaining in the ED until their evaluation is complete.  Workup initiated in triage    Anselmo Pickler, [place] 12/06/22 1527

## 2022-12-06 NOTE — ED Triage Notes (Signed)
Patient complains of abdominal pain and diarrhea, is alert, oriented, ambulating independently with steady gait and is in no apparent distress at this time.

## 2022-12-07 ENCOUNTER — Emergency Department (HOSPITAL_COMMUNITY): Payer: No Typology Code available for payment source

## 2022-12-07 ENCOUNTER — Encounter (HOSPITAL_COMMUNITY): Payer: Self-pay

## 2022-12-07 ENCOUNTER — Other Ambulatory Visit: Payer: Self-pay

## 2022-12-07 DIAGNOSIS — K810 Acute cholecystitis: Secondary | ICD-10-CM | POA: Diagnosis present

## 2022-12-07 LAB — HIV ANTIBODY (ROUTINE TESTING W REFLEX): HIV Screen 4th Generation wRfx: NONREACTIVE

## 2022-12-07 MED ORDER — METOPROLOL TARTRATE 5 MG/5ML IV SOLN: 5.0000 mg | Freq: Four times a day (QID) | INTRAVENOUS | Status: DC | PRN

## 2022-12-07 MED ORDER — SODIUM CHLORIDE 0.9 % IV SOLN
2.0000 g | INTRAVENOUS | Status: DC
  Administered 2022-12-07 – 2022-12-08 (×2): 2 g via INTRAVENOUS
  Filled 2022-12-07 (×2): qty 20

## 2022-12-07 MED ORDER — ENOXAPARIN SODIUM 40 MG/0.4ML IJ SOSY: 40.0000 mg | PREFILLED_SYRINGE | INTRAMUSCULAR | Status: DC

## 2022-12-07 MED ORDER — HYDROMORPHONE HCL 1 MG/ML IJ SOLN: 0.5000 mg | INTRAMUSCULAR | Status: DC | PRN

## 2022-12-07 MED ORDER — ONDANSETRON 4 MG PO TBDP: 4.0000 mg | ORAL_TABLET | Freq: Four times a day (QID) | ORAL | Status: DC | PRN

## 2022-12-07 MED ORDER — SODIUM CHLORIDE 0.9 % IV SOLN: INTRAVENOUS | Status: DC

## 2022-12-07 MED ORDER — OXYCODONE HCL 5 MG PO TABS
5.0000 mg | ORAL_TABLET | ORAL | Status: DC | PRN
  Administered 2022-12-07: 5 mg via ORAL
  Administered 2022-12-08 – 2022-12-09 (×2): 10 mg via ORAL
  Filled 2022-12-07: qty 2
  Filled 2022-12-07: qty 1
  Filled 2022-12-07: qty 2

## 2022-12-07 MED ORDER — DIPHENHYDRAMINE HCL 50 MG/ML IJ SOLN: 25.0000 mg | Freq: Four times a day (QID) | INTRAMUSCULAR | Status: DC | PRN

## 2022-12-07 MED ORDER — SIMETHICONE 80 MG PO CHEW
40.0000 mg | CHEWABLE_TABLET | Freq: Four times a day (QID) | ORAL | Status: DC | PRN
  Administered 2022-12-07: 40 mg via ORAL
  Filled 2022-12-07: qty 1

## 2022-12-07 MED ORDER — ACETAMINOPHEN 650 MG RE SUPP: 650.0000 mg | Freq: Four times a day (QID) | RECTAL | Status: DC | PRN

## 2022-12-07 MED ORDER — CHLORHEXIDINE GLUCONATE 0.12 % MT SOLN
OROMUCOSAL | Status: AC
  Filled 2022-12-07: qty 15

## 2022-12-07 MED ORDER — ONDANSETRON HCL 4 MG/2ML IJ SOLN: 4.0000 mg | Freq: Four times a day (QID) | INTRAMUSCULAR | Status: DC | PRN

## 2022-12-07 MED ORDER — ACETAMINOPHEN 325 MG PO TABS: 650.0000 mg | ORAL_TABLET | Freq: Four times a day (QID) | ORAL | Status: DC | PRN

## 2022-12-07 MED ORDER — DEXTROSE-NACL 5-0.45 % IV SOLN: INTRAVENOUS | Status: DC

## 2022-12-07 MED ORDER — POLYETHYLENE GLYCOL 3350 17 G PO PACK: 17.0000 g | PACK | Freq: Every day | ORAL | Status: DC | PRN

## 2022-12-07 MED ORDER — DIPHENHYDRAMINE HCL 25 MG PO CAPS: 25.0000 mg | ORAL_CAPSULE | Freq: Four times a day (QID) | ORAL | Status: DC | PRN

## 2022-12-07 MED ORDER — PANTOPRAZOLE SODIUM 40 MG IV SOLR
40.0000 mg | Freq: Once | INTRAVENOUS | Status: AC
  Administered 2022-12-07: 40 mg via INTRAVENOUS
  Filled 2022-12-07: qty 10

## 2022-12-07 NOTE — Discharge Instructions (Signed)
CIRUGIA LAPAROSCOPICA: INSTRUCCIONES DE POST OPERATORIO.  Revise siempre los documentos que le entreguen en el lugar donde se ha hecho la [place].  SI USTED NECESITA DOCUMENTOS DE INCAPACIDAD (DISABLE) O DE PERMISO FAMILAR (FAMILY LEAVE) NECESITA TRAERLOS A LA OFICINA PARA QUE SEAN PROCESADOS. NO  SE LOS DE A SU DOCTOR. A su alta del hospital se le dara una receta para Financial controller. Tomela como ha sido recetada, si la necesita. Si no la necesita puede tomar, Acetaminofen (Tylenol) o Ibuprofen (Advil) para aliviar dolor moderado. Continue tomando el resto de sus medicinas. Si necesita rellenar la receta, llame a la farmacia. ellos contactan a nuestra oficina pidiendo autorizacion. Este tipo de receta no pueden ser [employer] de las  5pm o [employer] fines de [place]. Con relacion a la dieta: debe ser [employer] primeros dias despues que llege a la casa. Ejemplo: sopas y galleticas. Tome bastante liquido esos dias. La [employer] de los pacientes padecen de inflamacion y cambio de coloracion de la piel alrededor de las incisiones. esto toma dias en resolver.  pnerse una bolsa de hielo en el area affectada ayuda..  Es comun tambien tener un poco de estrenimiento si esta tomado medicinas para Conservation officer, historic buildings. incremente la cantidad de liquidos a tomar y Doctor, hospital (Colace) esto previene el problema. Si ya tiene estrenimiento, es Software engineer no ha defecado en 48 horas, puede tomar un laxativo (Milk of Magnesia or Miralax) uselo como el paquete le explica.  A menos que se le diga algo diferente. Remueva el bendaje a las 24-48 horas despues dela [place]. y puede banarse en la ducha sin ningun problema. usted puede tener steri-strips (pequenas curitas transparentes en la piel puesta encima de la incision)  Estas banditas strips should be left on the skin for 7-10 days.   Si su cirujano puso pegamento encima de la incision usted puede banarse bajo la ducha en 24 horas. Este pegamento empezara a caerse en las  proximas 2-3 semanas. Si le pusieron suturas o presillas (grapos) estos seran quitados en su proxima cita en la oficina. . ACTIVIDADES:  Puede hacer actividad ligera.  Como caminar , subir escaleras y poco a poco irlas incrementando tanto como las [place]. Puede tener relaciones sexuales cuando sea comfortable. No carge objetos pesados o haga esfuerzos que no sean aprovados por su doctor. Puede manejar en cuanto no esta tomando medicamentos fuertes (narcoticos) para Conservation officer, historic buildings, pueda abrochar confortablemente el cinturon de seguridad, y pueda Psychologist, counselling y usar los pedales de su vehiculo con seguridad. PUEDE REGRESAR A TRABAJAR  Debe ver a su doctor para una cita de seguimiento en 2-3 semanas despues de la [place].   CUANDO LLAMAR A SU MEDICO: FIEBRE mayor de  101.0 No produccion de [place]. Sangramiento continue de la herida Incremento de Social research officer, government, enrojecimientio o drenaje de la herida (incision) Incremento de dolor abdominal.  The clinic staff is available to answer your questions during regular business hours.  Please don't hesitate to call and ask to speak to one of the nurses for clinical concerns.  If you have a medical emergency, go to the nearest emergency room or call 911.  A surgeon from [facility] is always on call at the hospital. [address], [place], [place], [place]  [address] ? [address], [place], [place]   [address] [phone] ? [phone] ? FAX [phone] Web site: [email or web address]

## 2022-12-07 NOTE — ED Provider Notes (Signed)
[facility] EMERGENCY DEPARTMENT Provider Note   CSN: [id number] Arrival date & time: 12/06/22  1440     History  Chief Complaint  Patient presents with   Abdominal Pain    Amy Salazar is a 47 y.o. female.  Patient is a 47 year old female who presents with abdominal pain.  History is obtained through video language line.  Patient states for about the last 8 months she has had some intermittent pains in her epigastric area.  She says over the last month they have gotten more frequent and intense.  It does not seem to be related to eating.  Nothing really brings it on.  She does say at times it goes straight through to her back.  It is intermittent.  She has some nausea associated with it sometimes and most recently she has had some loose stools associated with it.  No fevers.  No urinary symptoms.  No history of prior abdominal surgeries other than an appendectomy.  She does not report much of a history of known GERD.       Home Medications Prior to Admission medications   Medication Sig Start Date End Date Taking? Authorizing Provider  hydrocortisone 2.5 % ointment Apply topically 2 (two) times daily. 11/26/19   Myrene Buddy, MD  ibuprofen (ADVIL,MOTRIN) 200 MG tablet Take 400 mg by mouth every 6 (six) hours as needed for mild pain.    [provider]  metroNIDAZOLE (FLAGYL) 500 MG tablet Take 1 tablet (500 mg total) by mouth 2 (two) times daily. 02/12/20   Anyanwu, Jethro Bastos, MD  omeprazole (PRILOSEC) 20 MG capsule Take 1 capsule (20 mg total) by mouth 2 (two) times daily before a meal. Patient not taking: Reported on 10/22/2019 01/15/19   Geoffery Lyons, MD  valACYclovir (VALTREX) 1000 MG tablet Take 1 tablet (1,000 mg total) by mouth daily. 11/26/19   Myrene Buddy, MD      Allergies    Patient has no known allergies.    Review of Systems   Review of Systems  Constitutional:  Negative for chills, diaphoresis, fatigue and fever.  HENT:   Negative for congestion, rhinorrhea and sneezing.   Eyes: Negative.   Respiratory:  Negative for cough, chest tightness and shortness of breath.   Cardiovascular:  Negative for chest pain and leg swelling.  Gastrointestinal:  Positive for abdominal pain, diarrhea and nausea. Negative for blood in stool and vomiting.  Genitourinary:  Negative for difficulty urinating, flank pain, frequency and hematuria.  Musculoskeletal:  Negative for arthralgias and back pain.  Skin:  Negative for rash.  Neurological:  Negative for dizziness, speech difficulty, weakness, numbness and headaches.    Physical Exam Updated Vital Signs BP 117/63   Pulse 60   Temp 97.6 F (36.4 C) (Oral)   Resp 16   Ht 5\' 3"  (1.6 m)   Wt 73.9 kg   LMP 03/03/2019   SpO2 100%   BMI 28.86 kg/m  Physical Exam Constitutional:      Appearance: She is well-developed.  HENT:     Head: Normocephalic and atraumatic.  Eyes:     Pupils: Pupils are equal, round, and reactive to light.  Cardiovascular:     Rate and Rhythm: Normal rate and regular rhythm.     Heart sounds: Normal heart sounds.  Pulmonary:     Effort: Pulmonary effort is normal. No respiratory distress.     Breath sounds: Normal breath sounds. No wheezing or rales.  Chest:  Chest wall: No tenderness.  Abdominal:     General: Bowel sounds are normal.     Palpations: Abdomen is soft.     Tenderness: There is abdominal tenderness in the epigastric area. There is no guarding or rebound.  Musculoskeletal:        General: Normal range of motion.     Cervical back: Normal range of motion and neck supple.  Lymphadenopathy:     Cervical: No cervical adenopathy.  Skin:    General: Skin is warm and dry.     Findings: No rash.  Neurological:     Mental Status: She is alert and oriented to person, place, and time.     ED Results / Procedures / Treatments   Labs (all labs ordered are listed, but only abnormal results are displayed) Labs Reviewed   COMPREHENSIVE METABOLIC PANEL - Abnormal; Notable for the following components:      Result Value   Glucose, Bld 106 (*)    All other components within normal limits  URINALYSIS, ROUTINE W REFLEX MICROSCOPIC - Abnormal; Notable for the following components:   APPearance HAZY (*)    Hgb urine dipstick MODERATE (*)    Bacteria, UA RARE (*)    All other components within normal limits  LIPASE, BLOOD  CBC  HIV ANTIBODY (ROUTINE TESTING W REFLEX)  CBC  CREATININE, SERUM  I-STAT BETA HCG BLOOD, ED ([facility], [facility], [facility] ONLY)    EKG None  Radiology US Abdomen Limited RUQ (LIVER/GB)  Result Date: 12/07/2022 CLINICAL DATA:  Right upper quadrant pain.  Back pain. EXAM: ULTRASOUND ABDOMEN LIMITED RIGHT UPPER QUADRANT COMPARISON:  None Available. FINDINGS: Gallbladder: A dominant stone measuring 1.7 cm is identified near the neck of the gallbladder throughout the study. A positive Murphy's sign is reported. No wall thickening, pericholecystic fluid, or sludge. Common bile duct: Diameter: 5 mm Liver: Diffuse increased echogenicity throughout the liver. No focal mass. Portal vein is patent on color Doppler imaging with normal direction of blood flow towards the liver. Other: None. IMPRESSION: 1. A dominant stone is identified in the gallbladder neck throughout the study. A positive Murphy's sign is reported. No wall thickening, pericholecystic fluid, or sludge identified. Acute cholecystitis is not excluded based on the positive Murphy's sign and cholelithiasis. If the clinical picture remains ambiguous, recommend HIDA scan. 2. Probable hepatic steatosis. Electronically Signed   By: Dorise Bullion III M.D.   On: 12/07/2022 08:55    Procedures Procedures    Medications Ordered in ED Medications  enoxaparin (LOVENOX) injection 40 mg (has no administration in time range)  dextrose 5 %-0.45 % sodium chloride infusion (has no administration in time range)  cefTRIAXone (ROCEPHIN) 2 g in sodium chloride 0.9 %  100 mL IVPB (has no administration in time range)  acetaminophen (TYLENOL) tablet 650 mg (has no administration in time range)    Or  acetaminophen (TYLENOL) suppository 650 mg (has no administration in time range)  oxyCODONE (Oxy IR/ROXICODONE) immediate release tablet 5-10 mg (has no administration in time range)  HYDROmorphone (DILAUDID) injection 0.5 mg (has no administration in time range)  diphenhydrAMINE (BENADRYL) capsule 25 mg (has no administration in time range)    Or  diphenhydrAMINE (BENADRYL) injection 25 mg (has no administration in time range)  polyethylene glycol (MIRALAX / GLYCOLAX) packet 17 g (has no administration in time range)  ondansetron (ZOFRAN-ODT) disintegrating tablet 4 mg (has no administration in time range)    Or  ondansetron (ZOFRAN) injection 4 mg (has no administration  in time range)  simethicone (MYLICON) chewable tablet 40 mg (has no administration in time range)  metoprolol tartrate (LOPRESSOR) injection 5 mg (has no administration in time range)  pantoprazole (PROTONIX) injection 40 mg (40 mg Intravenous Given 12/07/22 0831)    ED Course/ Medical Decision Making/ A&P                             Medical Decision Making Amount and/or Complexity of Data Reviewed Radiology: ordered.  Risk Prescription drug management. Decision regarding hospitalization.   Patient is a 47 year old female who presents with abdominal pain.  Labs are nonconcerning.  No evidence of pancreatitis.  Her liver enzymes are normal.  Ultrasound shows a large stone in the gallbladder neck with a positive Murphy sign.  I consulted with general surgery.  They have evaluated the patient and will admit for cholecystectomy.  Currently her pain is well-controlled.  Final Clinical Impression(s) / ED Diagnoses Final diagnoses:  Cholecystitis    Rx / DC Orders ED Discharge Orders     None         Malvin Johns, MD 12/07/22 1148

## 2022-12-07 NOTE — H&P (Signed)
Admission Note  Amy Salazar 24-Nov-1975  [id number].    Requesting MD: Rolan Bucco, MD Chief Complaint/Reason for Consult: Acute cholecystitis  HPI:  Patient is an otherwise healthy 47 year old female who presented to [facility] with epigastric abdominal pain since Sunday. She reports that pain has been intermittent over the last 8 months but now occurring more frequently and this current episode is with increased severity. Pain is across upper abdomen and radiates to back and right shoulder. She denies nausea or vomiting currently but has had it with previous episodes. Having some diarrhea associated with current episode. Prior abdominal surgery includes laparoscopic appendectomy. She is not on any blood thinners and she does not have any medication allergies that she is aware of. She does have a hx of PONV. She is Spanish speaking and video interpreter was used for entirety of H&P. 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   ROS: Negative other than HPI  No family history on file.  Past Medical History:  Diagnosis Date   Breast cancer screening 01/24/2019   Gastroesophageal reflux disease 01/24/2019   Herpes    Pyelonephritis complicating pregnancy, antepartum 09/20/2011    Past Surgical History:  Procedure Laterality Date   APPENDECTOMY     pt was 47 yo    Social History:  reports that she has never smoked. She has never used smokeless tobacco. She reports that she does not drink alcohol and does not use drugs.  Allergies: No Known Allergies  (Not in a hospital admission)   Blood pressure 117/63, pulse 60, temperature 97.6 F (36.4 C), temperature source Oral, resp. rate 16, height 5\' 3"  (1.6 m), weight 73.9 kg, last menstrual period 03/03/2019, SpO2 100 %. Physical Exam:  General: pleasant, WD, WN female who is laying in bed in NAD HEENT: head is normocephalic, atraumatic.  Sclera are anicteric.  PERRL.  Ears and nose without any masses or lesions.   Mouth is pink and moist Heart: regular, rate, and rhythm.   Lungs: CTAB, no wheezes, rhonchi, or rales noted.  Respiratory effort nonlabored Abd: soft, mild ttp in RUQ, ND, +BS, no masses, hernias, or organomegaly MS: all 4 extremities are symmetrical with no cyanosis, clubbing, or edema. Skin: warm and dry with no masses, lesions, or rashes Neuro: Cranial nerves 2-12 grossly intact, sensation is normal throughout Psych: A&Ox3 with an appropriate affect.   Results for orders placed or performed during the hospital encounter of 12/06/22 (from the past 48 hour(s))  Urinalysis, Routine w reflex microscopic Urine, Clean Catch     Status: Abnormal   Collection Time: 12/06/22  2:40 PM  Result Value Ref Range   Color, Urine YELLOW YELLOW   APPearance HAZY (A) CLEAR   Specific Gravity, Urine 1.029 1.005 - 1.030   pH 5.0 5.0 - 8.0   Glucose, UA NEGATIVE NEGATIVE mg/dL   Hgb urine dipstick MODERATE (A) NEGATIVE   Bilirubin Urine NEGATIVE NEGATIVE   Ketones, ur NEGATIVE NEGATIVE mg/dL   Protein, ur NEGATIVE NEGATIVE mg/dL   Nitrite NEGATIVE NEGATIVE   Leukocytes,Ua NEGATIVE NEGATIVE   RBC / HPF 6-10 0 - 5 RBC/hpf   WBC, UA 0-5 0 - 5 WBC/hpf   Bacteria, UA RARE (A) NONE SEEN   Squamous Epithelial / HPF 11-20 0 - 5 /HPF   Mucus PRESENT     Comment: Performed at [facility], [address], [place], [place]  Lipase, blood     Status:  None   Collection Time: 12/06/22  3:35 PM  Result Value Ref Range   Lipase 32 11 - 51 U/L    Comment: Performed at [facility], [address], [place], [place] [address]  Comprehensive metabolic panel     Status: Abnormal   Collection Time: 12/06/22  3:35 PM  Result Value Ref Range   Sodium 138 135 - 145 mmol/L   Potassium 3.9 3.5 - 5.1 mmol/L   Chloride 106 98 - 111 mmol/L   CO2 25 22 - 32 mmol/L   Glucose, Bld 106 (H) 70 - 99 mg/dL    Comment: Glucose reference range applies only to samples taken after fasting for at least 8  hours.   BUN 13 6 - 20 mg/dL   Creatinine, Ser 0.78 0.44 - 1.00 mg/dL   Calcium 9.5 8.9 - 10.3 mg/dL   Total Protein 7.9 6.5 - 8.1 g/dL   Albumin 4.2 3.5 - 5.0 g/dL   AST 17 15 - 41 U/L   ALT 14 0 - 44 U/L   Alkaline Phosphatase 88 38 - 126 U/L   Total Bilirubin 0.4 0.3 - 1.2 mg/dL   GFR, Estimated >60 >60 mL/min    Comment: (NOTE) Calculated using the CKD-EPI Creatinine Equation (2021)    Anion gap 7 5 - 15    Comment: Performed at [facility] [address], [place], [place] [address]  CBC     Status: None   Collection Time: 12/06/22  3:35 PM  Result Value Ref Range   WBC 9.1 4.0 - 10.5 K/uL   RBC 4.70 3.87 - 5.11 MIL/uL   Hemoglobin 14.1 12.0 - 15.0 g/dL   HCT 42.0 36.0 - 46.0 %   MCV 89.4 80.0 - 100.0 fL   MCH 30.0 26.0 - 34.0 pg   MCHC 33.6 30.0 - 36.0 g/dL   RDW 12.3 11.5 - 15.5 %   Platelets 294 150 - 400 K/uL   nRBC 0.0 0.0 - 0.2 %    Comment: Performed at [facility], [address] [place], [place] [address]  I-Stat beta hCG blood, ED     Status: None   Collection Time: 12/06/22  3:55 PM  Result Value Ref Range   I-stat hCG, quantitative <5.0 <5 mIU/mL   Comment 3            Comment:   GEST. AGE      CONC.  (mIU/mL)   <=1 WEEK        5 - 50     2 WEEKS       50 - 500     3 WEEKS       100 - 10,000     4 WEEKS     1,000 - 30,000        FEMALE AND NON-PREGNANT FEMALE:     LESS THAN 5 mIU/mL    US Abdomen Limited RUQ (LIVER/GB)  Result Date: 12/07/2022 CLINICAL DATA:  Right upper quadrant pain.  Back pain. EXAM: ULTRASOUND ABDOMEN LIMITED RIGHT UPPER QUADRANT COMPARISON:  None Available. FINDINGS: Gallbladder: A dominant stone measuring 1.7 cm is identified near the neck of the gallbladder throughout the study. A positive Murphy's sign is reported. No wall thickening, pericholecystic fluid, or sludge. Common bile duct: Diameter: 5 mm Liver: Diffuse increased echogenicity throughout the liver. No focal mass. Portal vein is patent on color Doppler  imaging with normal direction of blood flow towards the liver. Other:  None. IMPRESSION: 1. A dominant stone is identified in the gallbladder neck throughout the study. A positive Murphy's sign is reported. No wall thickening, pericholecystic fluid, or sludge identified. Acute cholecystitis is not excluded based on the positive Murphy's sign and cholelithiasis. If the clinical picture remains ambiguous, recommend HIDA scan. 2. Probable hepatic steatosis. Electronically Signed   By: Dorise Bullion III M.D.   On: 12/07/2022 08:55      Assessment/Plan Acute cholecystitis  - RUQ [place] with 1.7 cm stone at the gallbladder neck and positive sonographic Murphy sign - No leukocytosis and LFTs WNL - mild ttp on exam but history of recurrent symptoms with increasing frequency and severity - recommend laparoscopic cholecystectomy - today vs tomorrow AM pending OR availability/scheduling today  - I have explained the procedure, risks, and aftercare of Laparoscopic cholecystectomy with IOC.  Risks include but are not limited to anesthesia (MI, CVA, death), bleeding, infection, wound problems, hernia, bile leak, injury to common bile duct/liver/intestine, increased risk of DVT/PE and diarrhea post op.  She seems to understand and agrees to proceed.  - admit to observation  FEN: NPO, IVF@ 75 cc/h VTE: LMWH post-op  ID: [email or web address]   I reviewed ED provider notes, last 24 h vitals and pain scores, last 48 h intake and output, last 24 h labs and trends, and last 24 h imaging results.   Norm Parcel, [facility] Surgery 12/07/2022, 11:34 AM Please see Amion for pager number during day hours 7:00am-4:30pm

## 2022-12-08 ENCOUNTER — Observation Stay (HOSPITAL_BASED_OUTPATIENT_CLINIC_OR_DEPARTMENT_OTHER): Payer: No Typology Code available for payment source | Admitting: Anesthesiology

## 2022-12-08 ENCOUNTER — Encounter (HOSPITAL_COMMUNITY)
Admission: EM | Disposition: A | Discharge status: Home / Self Care | Payer: Self-pay | Source: Home / Self Care | Attending: Emergency Medicine

## 2022-12-08 ENCOUNTER — Observation Stay (HOSPITAL_COMMUNITY): Payer: No Typology Code available for payment source | Admitting: Anesthesiology

## 2022-12-08 ENCOUNTER — Other Ambulatory Visit: Payer: Self-pay

## 2022-12-08 DIAGNOSIS — K8000 Calculus of gallbladder with acute cholecystitis without obstruction: Secondary | ICD-10-CM

## 2022-12-08 HISTORY — PX: CHOLECYSTECTOMY: SHX55

## 2022-12-08 LAB — COMPREHENSIVE METABOLIC PANEL
ALT: 14 U/L (ref 0–44)
AST: 18 U/L (ref 15–41)
Albumin: 3.2 g/dL — ABNORMAL LOW (ref 3.5–5.0)
Alkaline Phosphatase: 72 U/L (ref 38–126)
Anion gap: 7 (ref 5–15)
BUN: 14 mg/dL (ref 6–20)
CO2: 25 mmol/L (ref 22–32)
Calcium: 8.4 mg/dL — ABNORMAL LOW (ref 8.9–10.3)
Chloride: 107 mmol/L (ref 98–111)
Creatinine, Ser: 0.82 mg/dL (ref 0.44–1.00)
GFR, Estimated: >60 mL/min (ref >60)
Glucose, Bld: 101 mg/dL — ABNORMAL HIGH (ref 70–99)
Potassium: 3.9 mmol/L (ref 3.5–5.1)
Sodium: 139 mmol/L (ref 135–145)
Total Bilirubin: 0.6 mg/dL (ref 0.3–1.2)
Total Protein: 6.2 g/dL — ABNORMAL LOW (ref 6.5–8.1)

## 2022-12-08 LAB — CBC
HCT: 34.9 % — ABNORMAL LOW (ref 36.0–46.0)
Hemoglobin: 11.8 g/dL — ABNORMAL LOW (ref 12.0–15.0)
MCH: 30 pg (ref 26.0–34.0)
MCHC: 33.8 g/dL (ref 30.0–36.0)
MCV: 88.8 fL (ref 80.0–100.0)
Platelets: 242 10*3/uL (ref 150–400)
RBC: 3.93 MIL/uL (ref 3.87–5.11)
RDW: 12.3 % (ref 11.5–15.5)
WBC: 7.1 10*3/uL (ref 4.0–10.5)
nRBC: 0 % (ref 0.0–0.2)

## 2022-12-08 SURGERY — LAPAROSCOPIC CHOLECYSTECTOMY
Anesthesia: General | Site: Abdomen

## 2022-12-08 MED ORDER — HYDROMORPHONE HCL 1 MG/ML IJ SOLN
1.0000 mg | INTRAMUSCULAR | Status: DC | PRN
  Administered 2022-12-08: 1 mg via INTRAVENOUS
  Filled 2022-12-08: qty 1

## 2022-12-08 MED ORDER — FENTANYL CITRATE (PF) 100 MCG/2ML IJ SOLN
INTRAMUSCULAR | Status: AC
  Administered 2022-12-08: 25 ug via INTRAVENOUS
  Filled 2022-12-08: qty 2

## 2022-12-08 MED ORDER — LIDOCAINE 2% (20 MG/ML) 5 ML SYRINGE
INTRAMUSCULAR | Status: AC
  Filled 2022-12-08: qty 15

## 2022-12-08 MED ORDER — MIDAZOLAM HCL 5 MG/5ML IJ SOLN
INTRAMUSCULAR | Status: DC | PRN
  Administered 2022-12-08 (×2): 1 mg via INTRAVENOUS

## 2022-12-08 MED ORDER — 0.9 % SODIUM CHLORIDE (POUR BTL) OPTIME
TOPICAL | Status: DC | PRN
  Administered 2022-12-08: 1000 mL

## 2022-12-08 MED ORDER — DEXAMETHASONE SODIUM PHOSPHATE 10 MG/ML IJ SOLN
INTRAMUSCULAR | Status: DC | PRN
  Administered 2022-12-08: 10 mg via INTRAVENOUS

## 2022-12-08 MED ORDER — PHENYLEPHRINE 80 MCG/ML (10ML) SYRINGE FOR IV PUSH (FOR BLOOD PRESSURE SUPPORT)
PREFILLED_SYRINGE | INTRAVENOUS | Status: DC | PRN
  Administered 2022-12-08: 80 ug via INTRAVENOUS

## 2022-12-08 MED ORDER — SUGAMMADEX SODIUM 200 MG/2ML IV SOLN
INTRAVENOUS | Status: DC | PRN
  Administered 2022-12-08: 200 mg via INTRAVENOUS

## 2022-12-08 MED ORDER — SODIUM CHLORIDE 0.9 % IR SOLN
Status: DC | PRN
  Administered 2022-12-08: 1000 mL

## 2022-12-08 MED ORDER — ROCURONIUM BROMIDE 10 MG/ML (PF) SYRINGE
PREFILLED_SYRINGE | INTRAVENOUS | Status: AC
  Filled 2022-12-08: qty 10

## 2022-12-08 MED ORDER — ACETAMINOPHEN 10 MG/ML IV SOLN: 1000.0000 mg | Freq: Once | INTRAVENOUS | Status: DC | PRN

## 2022-12-08 MED ORDER — FENTANYL CITRATE (PF) 250 MCG/5ML IJ SOLN
INTRAMUSCULAR | Status: DC | PRN
  Administered 2022-12-08 (×2): 50 ug via INTRAVENOUS
  Administered 2022-12-08: 100 ug via INTRAVENOUS
  Administered 2022-12-08: 50 ug via INTRAVENOUS

## 2022-12-08 MED ORDER — BUPIVACAINE-EPINEPHRINE (PF) 0.5% -1:200000 IJ SOLN
INTRAMUSCULAR | Status: DC | PRN
  Administered 2022-12-08: 20 mL

## 2022-12-08 MED ORDER — PROPOFOL 10 MG/ML IV BOLUS
INTRAVENOUS | Status: DC | PRN
  Administered 2022-12-08: 120 mg via INTRAVENOUS

## 2022-12-08 MED ORDER — ROCURONIUM BROMIDE 10 MG/ML (PF) SYRINGE
PREFILLED_SYRINGE | INTRAVENOUS | Status: DC | PRN
  Administered 2022-12-08: 50 mg via INTRAVENOUS

## 2022-12-08 MED ORDER — LIDOCAINE 2% (20 MG/ML) 5 ML SYRINGE
INTRAMUSCULAR | Status: DC | PRN
  Administered 2022-12-08: 40 mg via INTRAVENOUS

## 2022-12-08 MED ORDER — FENTANYL CITRATE (PF) 100 MCG/2ML IJ SOLN: 25.0000 ug | INTRAMUSCULAR | Status: DC | PRN

## 2022-12-08 MED ORDER — CHLORHEXIDINE GLUCONATE 0.12 % MT SOLN
OROMUCOSAL | Status: AC
  Filled 2022-12-08: qty 15

## 2022-12-08 MED ORDER — ONDANSETRON HCL 4 MG/2ML IJ SOLN
INTRAMUSCULAR | Status: DC | PRN
  Administered 2022-12-08: 4 mg via INTRAVENOUS

## 2022-12-08 MED ORDER — FENTANYL CITRATE (PF) 250 MCG/5ML IJ SOLN
INTRAMUSCULAR | Status: AC
  Filled 2022-12-08: qty 5

## 2022-12-08 MED ORDER — PROPOFOL 10 MG/ML IV BOLUS
INTRAVENOUS | Status: AC
  Filled 2022-12-08: qty 20

## 2022-12-08 MED ORDER — MIDAZOLAM HCL 2 MG/2ML IJ SOLN
INTRAMUSCULAR | Status: AC
  Filled 2022-12-08: qty 2

## 2022-12-08 MED ORDER — SCOPOLAMINE 1 MG/3DAYS TD PT72
1.0000 | MEDICATED_PATCH | TRANSDERMAL | Status: DC
  Administered 2022-12-08: 1.5 mg via TRANSDERMAL
  Filled 2022-12-08: qty 1

## 2022-12-08 MED ORDER — BUPIVACAINE-EPINEPHRINE (PF) 0.5% -1:200000 IJ SOLN
INTRAMUSCULAR | Status: AC
  Filled 2022-12-08: qty 30

## 2022-12-08 MED ORDER — PHENYLEPHRINE 80 MCG/ML (10ML) SYRINGE FOR IV PUSH (FOR BLOOD PRESSURE SUPPORT)
PREFILLED_SYRINGE | INTRAVENOUS | Status: AC
  Filled 2022-12-08: qty 30

## 2022-12-08 MED ORDER — ACETAMINOPHEN 500 MG PO TABS
1000.0000 mg | ORAL_TABLET | ORAL | Status: AC
  Administered 2022-12-08: 1000 mg via ORAL
  Filled 2022-12-08: qty 2

## 2022-12-08 SURGICAL SUPPLY — 30 items
APPLIER CLIP 5 13 M/L LIGAMAX5 (MISCELLANEOUS) ×1
BAG COUNTER SPONGE SURGICOUNT (BAG) ×1 IMPLANT
CANISTER SUCT 3000ML PPV (MISCELLANEOUS) ×1 IMPLANT
CHLORAPREP W/TINT 26 (MISCELLANEOUS) ×1 IMPLANT
CLIP APPLIE 5 13 M/L LIGAMAX5 (MISCELLANEOUS) ×1 IMPLANT
COVER SURGICAL LIGHT HANDLE (MISCELLANEOUS) ×1 IMPLANT
DERMABOND ADVANCED .7 DNX12 (GAUZE/BANDAGES/DRESSINGS) ×1 IMPLANT
ELECT REM PT RETURN 9FT ADLT (ELECTROSURGICAL) ×1
ELECTRODE REM PT RTRN 9FT ADLT (ELECTROSURGICAL) ×1 IMPLANT
GLOVE SURG SIGNA 7.5 PF LTX (GLOVE) ×1 IMPLANT
GOWN STRL REUS W/ TWL LRG LVL3 (GOWN DISPOSABLE) ×2 IMPLANT
GOWN STRL REUS W/ TWL XL LVL3 (GOWN DISPOSABLE) ×1 IMPLANT
GOWN STRL REUS W/TWL LRG LVL3 (GOWN DISPOSABLE) ×2
GOWN STRL REUS W/TWL XL LVL3 (GOWN DISPOSABLE) ×1
KIT BASIN OR (CUSTOM PROCEDURE TRAY) ×1 IMPLANT
KIT TURNOVER KIT B (KITS) ×1 IMPLANT
NS IRRIG 1000ML POUR BTL (IV SOLUTION) ×1 IMPLANT
PAD ARMBOARD 7.5X6 YLW CONV (MISCELLANEOUS) ×1 IMPLANT
POUCH SPECIMEN RETRIEVAL 10MM (ENDOMECHANICALS) ×1 IMPLANT
SCISSORS LAP 5X35 DISP (ENDOMECHANICALS) ×1 IMPLANT
SET IRRIG TUBING LAPAROSCOPIC (IRRIGATION / IRRIGATOR) ×1 IMPLANT
SET TUBE SMOKE EVAC HIGH FLOW (TUBING) ×1 IMPLANT
SLEEVE ENDOPATH XCEL 5M (ENDOMECHANICALS) ×2 IMPLANT
SPECIMEN JAR SMALL (MISCELLANEOUS) ×1 IMPLANT
SUT MNCRL AB 4-0 PS2 18 (SUTURE) ×1 IMPLANT
TOWEL GREEN STERILE (TOWEL DISPOSABLE) ×1 IMPLANT
TRAY LAPAROSCOPIC MC (CUSTOM PROCEDURE TRAY) ×1 IMPLANT
TROCAR XCEL BLUNT TIP 100MML (ENDOMECHANICALS) ×1 IMPLANT
TROCAR Z-THREAD OPTICAL 5X100M (TROCAR) ×1 IMPLANT
WATER STERILE IRR 1000ML POUR (IV SOLUTION) ×1 IMPLANT

## 2022-12-08 NOTE — Anesthesia Postprocedure Evaluation (Signed)
Anesthesia Post Note  Patient: Amy Salazar  Procedure(s) Performed: LAPAROSCOPIC CHOLECYSTECTOMY (Abdomen)     Patient location during evaluation: PACU Anesthesia Type: General Level of consciousness: awake and alert Pain management: pain level controlled Vital Signs Assessment: post-procedure vital signs reviewed and stable Respiratory status: spontaneous breathing, nonlabored ventilation, respiratory function stable and patient connected to nasal cannula oxygen Cardiovascular status: blood pressure returned to baseline and stable Postop Assessment: no apparent nausea or vomiting Anesthetic complications: no  No notable events documented.  Last Vitals:  Vitals:   12/08/22 1515 12/08/22 1547  BP: 130/65 121/69  Pulse: (!) 52 60  Resp: 16 16  Temp:  36.4 C  SpO2: 98% 100%    Last Pain:  Vitals:   12/08/22 1632  TempSrc:   PainSc: Asleep                 Effie Berkshire

## 2022-12-08 NOTE — Op Note (Signed)
Laparoscopic Cholecystectomy Procedure Note  Indications: This patient presents with symptomatic gallbladder disease and will undergo laparoscopic cholecystectomy.  Pre-operative Diagnosis: acute cholecystitis with cholelithiasis  Post-operative Diagnosis: same  Surgeon: Coralie Keens MD  Assistants: Barkley Boards, PA  Anesthesia: General endotracheal anesthesia  ASA Class: 2  Procedure Details  The patient was seen again in the Holding Room. The risks, benefits, complications, treatment options, and expected outcomes were discussed with the patient. The possibilities of reaction to medication, pulmonary aspiration, perforation of viscus, bleeding, recurrent infection, finding a normal gallbladder, the need for additional procedures, failure to diagnose a condition, the possible need to convert to an open procedure, and creating a complication requiring transfusion or operation were discussed with the patient. The likelihood of improving the patient's symptoms with return to their baseline status is good.  The patient and/or family concurred with the proposed plan, giving informed consent. The site of surgery properly noted. The patient was taken to Operating Room, identified as Amy Salazar and the procedure verified as Laparoscopic Cholecystectomy with Intraoperative Cholangiogram. A Time Out was held and the above information confirmed.  Prior to the induction of general anesthesia, antibiotic prophylaxis was administered. General endotracheal anesthesia was then administered and tolerated well. After the induction, the abdomen was prepped with Chloraprep and draped in sterile fashion. The patient was positioned in the supine position.  Local anesthetic agent was injected into the skin near the umbilicus and an incision made. We dissected down to the abdominal fascia with blunt dissection.  The fascia was incised vertically and we entered the peritoneal cavity bluntly.  A  pursestring suture of 0-Vicryl was placed around the fascial opening.  The Hasson cannula was inserted and secured with the stay suture.  Pneumoperitoneum was then created with CO2 and tolerated well without any adverse changes in the patient's vital signs. A 5-mm port was placed in the subxiphoid position.  Two 5-mm ports were placed in the right upper quadrant. All skin incisions were infiltrated with a local anesthetic agent before making the incision and placing the trocars.   We positioned the patient in reverse Trendelenburg, tilted slightly to the patient's left.  The gallbladder was identified, the fundus grasped and retracted cephalad. Adhesions were lysed bluntly and with the electrocautery where indicated, taking care not to injure any adjacent organs or viscus. The infundibulum was grasped and retracted laterally, exposing the peritoneum overlying the triangle of Calot. This was then divided and exposed in a blunt fashion. The cystic duct was clearly identified and bluntly dissected circumferentially. A critical view of the cystic duct and cystic artery was obtained.  The cystic duct was then ligated with clips and divided. The cystic artery was, dissected free, ligated with clips and divided as well.   The gallbladder was dissected from the liver bed in retrograde fashion with the electrocautery. The gallbladder was removed and placed in an Endocatch sac. The liver bed was irrigated and inspected. Hemostasis was achieved with the electrocautery. Copious irrigation was utilized and was repeatedly aspirated until clear.  The gallbladder and Endocatch sac were then removed through the umbilical port site.  The pursestring suture was used to close the umbilical fascia.    We again inspected the right upper quadrant for hemostasis.  Pneumoperitoneum was released as we removed the trocars.  4-0 Monocryl was used to close the skin.   Skin glue was then applied. The patient was then extubated and brought  to the recovery room in stable condition. Instrument, sponge,  and needle counts were correct at closure and at the conclusion of the case.   Findings: Mild Cholecystitis with Cholelithiasis  Estimated Blood Loss: Minimal         Drains: none         Specimens: Gallbladder           Complications: None; patient tolerated the procedure well.         Disposition: PACU - hemodynamically stable.         Condition: stable

## 2022-12-08 NOTE — Transfer of Care (Signed)
Immediate Anesthesia Transfer of Care Note  Patient: Amy Salazar  Procedure(s) Performed: LAPAROSCOPIC CHOLECYSTECTOMY (Abdomen)  Patient Location: PACU  Anesthesia Type:General  Level of Consciousness: awake, alert , and oriented  Airway & Oxygen Therapy: Patient Spontanous Breathing and Patient connected to nasal cannula oxygen  Post-op Assessment: Report given to RN, Post -op Vital signs reviewed and stable, and Patient moving all extremities X 4  Post vital signs: Reviewed and stable  Last Vitals:  Vitals Value Taken Time  BP 153/73 12/08/22 1436  Temp 36.5 C 12/08/22 1435  Pulse 63 12/08/22 1437  Resp 12 12/08/22 1437  SpO2 100 % 12/08/22 1437  Vitals shown include unvalidated device data.  Last Pain:  Vitals:   12/08/22 1200  TempSrc: Oral  PainSc: 0-No pain         Complications: No notable events documented.

## 2022-12-08 NOTE — Anesthesia Procedure Notes (Signed)
Procedure Name: Intubation Date/Time: 12/08/2022 1:54 PM  Performed by: Mariea Clonts, CRNAPre-anesthesia Checklist: Patient identified, Emergency Drugs available, Suction available and Patient being monitored Patient Re-evaluated:Patient Re-evaluated prior to induction Oxygen Delivery Method: Circle System Utilized Preoxygenation: Pre-oxygenation with 100% oxygen Induction Type: IV induction Ventilation: Mask ventilation without difficulty and Oral airway inserted - appropriate to patient size Laryngoscope Size: 2 Grade View: Grade I Tube type: Oral Tube size: 7.0 mm Number of attempts: 1 Airway Equipment and Method: Stylet and Oral airway Placement Confirmation: ETT inserted through vocal cords under direct vision, positive ETCO2 and breath sounds checked- equal and bilateral Tube secured with: Tape Dental Injury: Teeth and Oropharynx as per pre-operative assessment

## 2022-12-08 NOTE — Anesthesia Preprocedure Evaluation (Addendum)
Anesthesia Evaluation  Patient identified by MRN, date of birth, ID band Patient awake    Reviewed: Allergy & Precautions, NPO status , Patient's Chart, lab work & pertinent test results  Airway Mallampati: II  TM Distance: >3 FB Neck ROM: Full    Dental  (+) Teeth Intact, Dental Advisory Given   Pulmonary neg pulmonary ROS   breath sounds clear to auscultation       Cardiovascular negative cardio ROS  Rhythm:Regular Rate:Normal     Neuro/Psych negative neurological ROS  negative psych ROS   GI/Hepatic Neg liver ROS,GERD  Medicated,,Cholecystitis    Endo/Other  negative endocrine ROS    Renal/GU negative Renal ROS  negative genitourinary   Musculoskeletal   Abdominal   Peds  Hematology   Anesthesia Other Findings   Reproductive/Obstetrics                             Anesthesia Physical Anesthesia Plan  ASA: 2  Anesthesia Plan: General   Post-op Pain Management: Toradol IV (intra-op)* and Tylenol PO (pre-op)*   Induction: Intravenous  PONV Risk Score and Plan: 3 and Ondansetron, Dexamethasone, Midazolam and Treatment may vary due to age or medical condition  Airway Management Planned: Mask and Oral ETT  Additional Equipment: None  Intra-op Plan:   Post-operative Plan: Extubation in OR  Informed Consent: I have reviewed the patients History and Physical, chart, labs and discussed the procedure including the risks, benefits and alternatives for the proposed anesthesia with the patient or authorized representative who has indicated his/her understanding and acceptance.     Dental advisory given  Plan Discussed with:   Anesthesia Plan Comments:        Anesthesia Quick Evaluation

## 2022-12-08 NOTE — Plan of Care (Signed)
  Problem: Education: ?Goal: Knowledge of General Education information will improve ?Description: Including pain rating scale, medication(s)/side effects and non-pharmacologic comfort measures ?Outcome: Progressing ?  ?Problem: Activity: ?Goal: Risk for activity intolerance will decrease ?Outcome: Progressing ?  ?Problem: Coping: ?Goal: Level of anxiety will decrease ?Outcome: Progressing ?  ?Problem: Pain Managment: ?Goal: General experience of comfort will improve ?Outcome: Progressing ?  ?Problem: Safety: ?Goal: Ability to remain free from injury will improve ?Outcome: Progressing ?  ?

## 2022-12-08 NOTE — Progress Notes (Signed)
Patient ID: Loleta Dicker, female   DOB: 1975-12-29, 47 y.o.   MRN: [id number]   Pre Procedure note for inpatients:   Loleta Dicker has been scheduled for Procedure(s): LAPAROSCOPIC CHOLECYSTECTOMY (N/A) today. The various methods of treatment have been discussed with the patient. After consideration of the risks, benefits and treatment options the patient has consented to the planned procedure.   The patient has been seen and labs reviewed. There are no changes in the patient's condition to prevent proceeding with the planned procedure today.  Recent labs:  Lab Results  Component Value Date   WBC 7.1 12/08/2022   HGB 11.8 (L) 12/08/2022   HCT 34.9 (L) 12/08/2022   PLT 242 12/08/2022   GLUCOSE 101 (H) 12/08/2022   ALT 14 12/08/2022   AST 18 12/08/2022   NA 139 12/08/2022   K 3.9 12/08/2022   CL 107 12/08/2022   CREATININE 0.82 12/08/2022   BUN 14 12/08/2022   CO2 25 12/08/2022    Coralie Keens, MD 12/08/2022 9:53 AM

## 2022-12-09 ENCOUNTER — Encounter (HOSPITAL_COMMUNITY): Payer: Self-pay | Admitting: Surgery

## 2022-12-09 ENCOUNTER — Other Ambulatory Visit (HOSPITAL_COMMUNITY): Payer: Self-pay

## 2022-12-09 LAB — SURGICAL PATHOLOGY

## 2022-12-09 MED ORDER — OXYCODONE HCL 5 MG PO TABS: 5.0000 mg | ORAL_TABLET | Freq: Four times a day (QID) | ORAL | 0 refills | Status: AC | PRN

## 2022-12-09 MED ORDER — ACETAMINOPHEN 325 MG PO TABS: 1000.0000 mg | ORAL_TABLET | Freq: Four times a day (QID) | ORAL | 0 refills | Status: AC | PRN

## 2022-12-09 NOTE — Progress Notes (Signed)
Discharge instructions given to pt via interpretor. Pt verbalized understanding of all teaching and had no further questions. Family already in room to take pt home for discharge.

## 2022-12-09 NOTE — Social Work (Signed)
CSW acknowledges consult for assistance with Medicaid. Pt does not currently have insurance on the chart. CSW sent information to financial counseling, who will screen pt for candidacy. TOC will continue to follow.

## 2022-12-09 NOTE — Progress Notes (Signed)
Pt just stated that her son will be picking her up and he is on the way. Discharge lounge not open at this time.

## 2022-12-09 NOTE — Progress Notes (Signed)
Pt c/o some right shoulder pain. Used interpretive services to talk with her about her pain and explain that it is most likely referred pain from the gas they used during surgery, that a heating pad may help at home and ambulation. Gave pain medicine and asked if she had any further questions about her DC instructions.

## 2022-12-13 NOTE — Discharge Summary (Addendum)
[facility] Discharge Summary   Patient ID: Amy Salazar MRN: [id number] DOB/AGE: [birth year] 47 y.o.  Admit date: 12/06/2022 Discharge date: 12/09/2022  Admitting Diagnosis: Acute cholecystitis   Discharge Diagnosis S/P laparoscopic cholecystectomy   Consultants None  Imaging: No results found.  Procedures Dr. Coralie Keens (12/08/22) - Laparoscopic Cholecystectomy   Hospital Course:  Patient is a 47 year old female who presented to the ED with abdominal pain.  Workup showed acute cholecystitis .  Patient was admitted and underwent procedure listed above.  Tolerated procedure well and was transferred to the floor.  Diet was advanced as tolerated.  On POD1, the patient was voiding well, tolerating diet, ambulating well, pain well controlled, vital signs stable, incisions c/d/i and felt stable for discharge home.  Patient will follow up in our office in 3-4 weeks and knows to call with questions or concerns. She will call to confirm appointment date/time.    Physical Exam: General:  Alert, NAD, pleasant, comfortable Abd:  Soft, ND, mild tenderness, incisions C/D/I  I or a member of my team have reviewed this patient in the Controlled Substance Database.   Allergies as of 12/09/2022   No Known Allergies      Medication List     STOP taking these medications    hydrocortisone 2.5 % ointment   metroNIDAZOLE 500 MG tablet Commonly known as: FLAGYL   valACYclovir 1000 MG tablet Commonly known as: VALTREX       TAKE these medications    acetaminophen 325 MG tablet Commonly known as: TYLENOL Take 3 tablets (975 mg total) by mouth every 6 (six) hours as needed for up to 10 days for mild pain.   ibuprofen 200 MG tablet Commonly known as: ADVIL Take 400 mg by mouth every 6 (six) hours as needed for mild pain.   omeprazole 20 MG capsule Commonly known as: PRILOSEC Take 1 capsule (20 mg total) by mouth 2 (two) times daily before a meal.    oxyCODONE 5 MG immediate release tablet Commonly known as: Oxy IR/ROXICODONE Take 1 tablet (5 mg total) by mouth every 6 (six) hours as needed for moderate pain (not relieved by tylenol or advil).          Follow-up Information     Maczis, Carlena Hurl, [place]. Go on 12/28/2022.   Specialty: General Surgery Why: 8:30 AM. Por favor llegue 30 minutos antes de la hora de la cita. Contact information: [place] [address] [phone]                 Signed: Norm Parcel , [facility] 12/13/2022, 2:17 PM Please see Amion for pager number during day hours 7:00am-4:30pm
